# Patient Record
Sex: Male | Born: 1985 | Race: Black or African American | Hispanic: No | Marital: Single | Smoking: Current every day smoker
Health system: Southern US, Community
[De-identification: ages and names within clinical notes are randomized; demographics above are authoritative.]

## PROBLEM LIST (undated history)

## (undated) DIAGNOSIS — F1010 Alcohol abuse, uncomplicated: Secondary | ICD-10-CM

## (undated) DIAGNOSIS — K769 Liver disease, unspecified: Secondary | ICD-10-CM

## (undated) HISTORY — PX: FINGER ARTHROPLASTY: SHX5017

## (undated) HISTORY — PX: TONSILLECTOMY: SUR1361

---
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
## 2008-11-23 ENCOUNTER — Emergency Department (HOSPITAL_COMMUNITY): Admission: EM | Admit: 2008-11-23 | Discharge: 2008-11-23 | Payer: Self-pay | Admitting: Emergency Medicine

## 2009-10-31 ENCOUNTER — Emergency Department (HOSPITAL_COMMUNITY): Admission: EM | Admit: 2009-10-31 | Discharge: 2009-11-01 | Payer: Self-pay | Admitting: Emergency Medicine

## 2009-11-06 ENCOUNTER — Emergency Department (HOSPITAL_COMMUNITY): Admission: EM | Admit: 2009-11-06 | Discharge: 2009-11-06 | Payer: Self-pay | Admitting: Emergency Medicine

## 2009-11-08 ENCOUNTER — Emergency Department (HOSPITAL_COMMUNITY): Admission: EM | Admit: 2009-11-08 | Discharge: 2009-11-08 | Payer: Self-pay | Admitting: Emergency Medicine

## 2010-11-29 ENCOUNTER — Emergency Department (HOSPITAL_COMMUNITY): Admission: EM | Admit: 2010-11-29 | Discharge: 2010-11-29 | Payer: Self-pay | Source: Home / Self Care

## 2010-11-30 ENCOUNTER — Ambulatory Visit (HOSPITAL_COMMUNITY)
Admission: RE | Admit: 2010-11-30 | Discharge: 2010-11-30 | Disposition: A | Discharge status: Home / Self Care | Payer: Self-pay | Source: Ambulatory Visit | Attending: Family Medicine | Admitting: Family Medicine

## 2010-11-30 ENCOUNTER — Other Ambulatory Visit (HOSPITAL_COMMUNITY): Payer: Self-pay | Admitting: Family Medicine

## 2010-11-30 DIAGNOSIS — R059 Cough, unspecified: Secondary | ICD-10-CM

## 2010-11-30 DIAGNOSIS — R05 Cough: Secondary | ICD-10-CM

## 2011-01-14 ENCOUNTER — Emergency Department (HOSPITAL_COMMUNITY)
Admission: EM | Admit: 2011-01-14 | Discharge: 2011-01-14 | Disposition: A | Discharge status: Home / Self Care | Payer: Self-pay | Attending: Emergency Medicine | Admitting: Emergency Medicine

## 2011-01-14 DIAGNOSIS — R21 Rash and other nonspecific skin eruption: Secondary | ICD-10-CM | POA: Insufficient documentation

## 2011-01-14 DIAGNOSIS — B029 Zoster without complications: Secondary | ICD-10-CM | POA: Insufficient documentation

## 2011-01-14 DIAGNOSIS — R000 Tachycardia, unspecified: Secondary | ICD-10-CM | POA: Insufficient documentation

## 2011-01-15 LAB — ETHANOL: Alcohol, Ethyl (B): 169 mg/dL — ABNORMAL HIGH (ref 0–10)

## 2011-01-15 LAB — DIFFERENTIAL
Basophils Absolute: 0.1 10*3/uL (ref 0.0–0.1)
Eosinophils Absolute: 0.1 10*3/uL (ref 0.0–0.7)
Lymphs Abs: 3.8 10*3/uL (ref 0.7–4.0)
Monocytes Relative: 6 % (ref 3–12)
Neutrophils Relative %: 58 % (ref 43–77)

## 2011-01-15 LAB — RAPID URINE DRUG SCREEN, HOSP PERFORMED
Barbiturates: NOT DETECTED
Cocaine: NOT DETECTED
Opiates: POSITIVE — AB
Tetrahydrocannabinol: POSITIVE — AB

## 2011-01-15 LAB — POCT I-STAT, CHEM 8
Calcium, Ion: 0.9 mmol/L — ABNORMAL LOW (ref 1.12–1.32)
Hemoglobin: 16.7 g/dL (ref 13.0–17.0)
Potassium: 4.1 mEq/L (ref 3.5–5.1)
Sodium: 140 mEq/L (ref 135–145)
TCO2: 21 mmol/L (ref 0–100)

## 2011-01-15 LAB — SYNOVIAL CELL COUNT + DIFF, W/ CRYSTALS
Crystals, Fluid: NONE SEEN
Eosinophils-Synovial: 1 % (ref 0–1)
Monocyte-Macrophage-Synovial Fluid: 3 % — ABNORMAL LOW (ref 50–90)
Neutrophil, Synovial: 85 % — ABNORMAL HIGH (ref 0–25)
WBC, Synovial: 2963 /mm3 — ABNORMAL HIGH (ref 0–200)

## 2011-01-15 LAB — URINE MICROSCOPIC-ADD ON

## 2011-01-15 LAB — URINALYSIS, ROUTINE W REFLEX MICROSCOPIC
Nitrite: NEGATIVE
Protein, ur: NEGATIVE mg/dL

## 2011-01-15 LAB — CBC
Hemoglobin: 16.5 g/dL (ref 13.0–17.0)
MCHC: 34.6 g/dL (ref 30.0–36.0)
Platelets: 276 10*3/uL (ref 150–400)
RBC: 4.99 MIL/uL (ref 4.22–5.81)
WBC: 10.8 10*3/uL — ABNORMAL HIGH (ref 4.0–10.5)

## 2011-01-15 LAB — BODY FLUID CULTURE

## 2011-02-13 LAB — DIFFERENTIAL
Basophils Relative: 0 % (ref 0–1)
Eosinophils Absolute: 0.1 10*3/uL (ref 0.0–0.7)
Lymphs Abs: 1.5 10*3/uL (ref 0.7–4.0)
Monocytes Absolute: 0.8 10*3/uL (ref 0.1–1.0)
Monocytes Relative: 10 % (ref 3–12)

## 2011-02-13 LAB — COMPREHENSIVE METABOLIC PANEL
ALT: 22 U/L (ref 0–53)
AST: 33 U/L (ref 0–37)
Albumin: 4.1 g/dL (ref 3.5–5.2)
Alkaline Phosphatase: 118 U/L — ABNORMAL HIGH (ref 39–117)
Calcium: 9.8 mg/dL (ref 8.4–10.5)
GFR calc Af Amer: >60 mL/min (ref >60)
Potassium: 4.4 mEq/L (ref 3.5–5.1)
Sodium: 137 mEq/L (ref 135–145)
Total Protein: 7.3 g/dL (ref 6.0–8.3)

## 2011-02-13 LAB — URINALYSIS, ROUTINE W REFLEX MICROSCOPIC
Bilirubin Urine: NEGATIVE
Glucose, UA: NEGATIVE mg/dL
Hgb urine dipstick: NEGATIVE
Protein, ur: NEGATIVE mg/dL
Urobilinogen, UA: 2 mg/dL — ABNORMAL HIGH (ref 0.0–1.0)

## 2011-02-13 LAB — CBC
Hemoglobin: 16 g/dL (ref 13.0–17.0)
MCHC: 33.5 g/dL (ref 30.0–36.0)
Platelets: 314 10*3/uL (ref 150–400)
RDW: 13.2 % (ref 11.5–15.5)

## 2011-04-18 ENCOUNTER — Ambulatory Visit (HOSPITAL_COMMUNITY)
Admission: RE | Admit: 2011-04-18 | Discharge: 2011-04-18 | Disposition: A | Discharge status: Home / Self Care | Payer: 59 | Attending: Psychiatry | Admitting: Psychiatry

## 2011-04-18 DIAGNOSIS — F1020 Alcohol dependence, uncomplicated: Secondary | ICD-10-CM | POA: Insufficient documentation

## 2011-04-21 ENCOUNTER — Other Ambulatory Visit (HOSPITAL_COMMUNITY): Payer: PRIVATE HEALTH INSURANCE | Admitting: Psychiatry

## 2011-04-24 ENCOUNTER — Other Ambulatory Visit (HOSPITAL_COMMUNITY): Payer: PRIVATE HEALTH INSURANCE | Attending: Psychiatry | Admitting: Psychiatry

## 2011-04-24 DIAGNOSIS — F1020 Alcohol dependence, uncomplicated: Secondary | ICD-10-CM | POA: Insufficient documentation

## 2011-04-26 ENCOUNTER — Other Ambulatory Visit (HOSPITAL_COMMUNITY): Payer: PRIVATE HEALTH INSURANCE | Admitting: Psychiatry

## 2011-04-28 ENCOUNTER — Other Ambulatory Visit (HOSPITAL_COMMUNITY): Payer: PRIVATE HEALTH INSURANCE | Admitting: Psychiatry

## 2011-05-01 ENCOUNTER — Other Ambulatory Visit (HOSPITAL_COMMUNITY): Payer: PRIVATE HEALTH INSURANCE | Admitting: Psychiatry

## 2011-05-05 ENCOUNTER — Other Ambulatory Visit (HOSPITAL_COMMUNITY): Payer: PRIVATE HEALTH INSURANCE | Admitting: Psychiatry

## 2011-05-08 ENCOUNTER — Other Ambulatory Visit (HOSPITAL_COMMUNITY): Payer: PRIVATE HEALTH INSURANCE | Admitting: Psychiatry

## 2011-05-10 ENCOUNTER — Other Ambulatory Visit (HOSPITAL_COMMUNITY): Payer: PRIVATE HEALTH INSURANCE | Admitting: Psychiatry

## 2011-05-12 ENCOUNTER — Other Ambulatory Visit (HOSPITAL_COMMUNITY): Payer: PRIVATE HEALTH INSURANCE | Admitting: Psychiatry

## 2011-05-15 ENCOUNTER — Other Ambulatory Visit (HOSPITAL_COMMUNITY): Payer: PRIVATE HEALTH INSURANCE | Attending: Psychiatry | Admitting: Psychiatry

## 2011-05-17 ENCOUNTER — Other Ambulatory Visit (HOSPITAL_COMMUNITY): Payer: PRIVATE HEALTH INSURANCE | Admitting: Psychiatry

## 2011-05-19 ENCOUNTER — Other Ambulatory Visit (HOSPITAL_COMMUNITY): Payer: PRIVATE HEALTH INSURANCE | Admitting: Psychiatry

## 2011-05-22 ENCOUNTER — Other Ambulatory Visit (HOSPITAL_COMMUNITY): Payer: PRIVATE HEALTH INSURANCE | Admitting: Psychiatry

## 2011-05-24 ENCOUNTER — Other Ambulatory Visit (HOSPITAL_COMMUNITY): Payer: PRIVATE HEALTH INSURANCE | Admitting: Psychiatry

## 2011-05-26 ENCOUNTER — Other Ambulatory Visit (HOSPITAL_COMMUNITY): Payer: PRIVATE HEALTH INSURANCE | Admitting: Psychiatry

## 2011-05-29 ENCOUNTER — Other Ambulatory Visit (HOSPITAL_COMMUNITY): Payer: PRIVATE HEALTH INSURANCE | Admitting: Psychiatry

## 2011-05-31 ENCOUNTER — Other Ambulatory Visit (HOSPITAL_COMMUNITY): Payer: PRIVATE HEALTH INSURANCE | Admitting: Psychiatry

## 2011-06-02 ENCOUNTER — Other Ambulatory Visit (HOSPITAL_COMMUNITY): Payer: PRIVATE HEALTH INSURANCE | Admitting: Psychiatry

## 2011-06-05 ENCOUNTER — Other Ambulatory Visit (HOSPITAL_COMMUNITY): Payer: PRIVATE HEALTH INSURANCE | Admitting: Psychiatry

## 2011-06-07 ENCOUNTER — Other Ambulatory Visit (HOSPITAL_COMMUNITY): Payer: PRIVATE HEALTH INSURANCE | Admitting: Psychiatry

## 2011-06-09 ENCOUNTER — Other Ambulatory Visit (HOSPITAL_COMMUNITY): Payer: PRIVATE HEALTH INSURANCE | Admitting: Psychiatry

## 2011-06-12 ENCOUNTER — Other Ambulatory Visit (HOSPITAL_COMMUNITY): Payer: PRIVATE HEALTH INSURANCE | Admitting: Psychiatry

## 2011-06-14 ENCOUNTER — Other Ambulatory Visit (HOSPITAL_COMMUNITY): Payer: PRIVATE HEALTH INSURANCE | Admitting: Psychiatry

## 2011-12-26 ENCOUNTER — Emergency Department (HOSPITAL_COMMUNITY)
Admission: EM | Admit: 2011-12-26 | Discharge: 2011-12-27 | Disposition: A | Discharge status: Home / Self Care | Payer: Self-pay | Attending: Emergency Medicine | Admitting: Emergency Medicine

## 2011-12-26 DIAGNOSIS — R10814 Left lower quadrant abdominal tenderness: Secondary | ICD-10-CM | POA: Insufficient documentation

## 2011-12-26 DIAGNOSIS — R10816 Epigastric abdominal tenderness: Secondary | ICD-10-CM | POA: Insufficient documentation

## 2011-12-26 DIAGNOSIS — F1010 Alcohol abuse, uncomplicated: Secondary | ICD-10-CM | POA: Insufficient documentation

## 2011-12-26 DIAGNOSIS — R1031 Right lower quadrant pain: Secondary | ICD-10-CM | POA: Insufficient documentation

## 2011-12-26 DIAGNOSIS — F17200 Nicotine dependence, unspecified, uncomplicated: Secondary | ICD-10-CM | POA: Insufficient documentation

## 2011-12-26 DIAGNOSIS — R000 Tachycardia, unspecified: Secondary | ICD-10-CM | POA: Insufficient documentation

## 2011-12-26 NOTE — ED Notes (Signed)
Patient complaining of sharp, stabbing abdominal pain that started around 2000 tonight; patient states that he has been drinking all weekend -- patient states that he is unable to determine how much he has had to drink, but that him and his friends had went through bottles of liquor; patient does have ETOH on board tonight.  Patient reports nausea and vomiting; last emesis 1600 this afternoon.  Patient reporting cold sweats, chills, and fever along with the abdominal pain.

## 2011-12-27 ENCOUNTER — Emergency Department (HOSPITAL_COMMUNITY): Payer: Self-pay

## 2011-12-27 ENCOUNTER — Encounter (HOSPITAL_COMMUNITY): Payer: Self-pay | Admitting: Emergency Medicine

## 2011-12-27 DIAGNOSIS — IMO0001 Reserved for inherently not codable concepts without codable children: Secondary | ICD-10-CM | POA: Insufficient documentation

## 2011-12-27 LAB — CBC
HCT: 43.9 % (ref 39.0–52.0)
Hemoglobin: 16 g/dL (ref 13.0–17.0)
MCV: 89.8 fL (ref 78.0–100.0)
WBC: 6.6 10*3/uL (ref 4.0–10.5)

## 2011-12-27 LAB — COMPREHENSIVE METABOLIC PANEL
Alkaline Phosphatase: 85 U/L (ref 39–117)
BUN: 6 mg/dL (ref 6–23)
Chloride: 105 mEq/L (ref 96–112)
Creatinine, Ser: 0.92 mg/dL (ref 0.50–1.35)
GFR calc Af Amer: >90 mL/min (ref >90)
GFR calc non Af Amer: >90 mL/min (ref >90)
Glucose, Bld: 103 mg/dL — ABNORMAL HIGH (ref 70–99)
Potassium: 3.6 mEq/L (ref 3.5–5.1)
Total Bilirubin: 1.9 mg/dL — ABNORMAL HIGH (ref 0.3–1.2)

## 2011-12-27 LAB — URINALYSIS, ROUTINE W REFLEX MICROSCOPIC
Glucose, UA: NEGATIVE mg/dL
Hgb urine dipstick: NEGATIVE
Ketones, ur: NEGATIVE mg/dL
Leukocytes, UA: NEGATIVE
Protein, ur: NEGATIVE mg/dL
pH: 6 (ref 5.0–8.0)

## 2011-12-27 LAB — LIPASE, BLOOD: Lipase: 17 U/L (ref 11–59)

## 2011-12-27 MED ORDER — FAMOTIDINE 20 MG PO TABS: 20.0000 mg | ORAL_TABLET | Freq: Two times a day (BID) | ORAL | Status: DC

## 2011-12-27 MED ORDER — GI COCKTAIL ~~LOC~~
30.0000 mL | Freq: Once | ORAL | Status: AC
  Administered 2011-12-27: 30 mL via ORAL
  Filled 2011-12-27: qty 30

## 2011-12-27 MED ORDER — ONDANSETRON HCL 4 MG/2ML IJ SOLN
4.0000 mg | Freq: Once | INTRAMUSCULAR | Status: AC
  Administered 2011-12-27: 4 mg via INTRAVENOUS
  Filled 2011-12-27: qty 2

## 2011-12-27 MED ORDER — MORPHINE SULFATE 4 MG/ML IJ SOLN
4.0000 mg | Freq: Once | INTRAMUSCULAR | Status: AC
  Administered 2011-12-27: 4 mg via INTRAVENOUS
  Filled 2011-12-27: qty 1

## 2011-12-27 MED ORDER — PANTOPRAZOLE SODIUM 40 MG IV SOLR
40.0000 mg | Freq: Once | INTRAVENOUS | Status: AC
  Administered 2011-12-27: 40 mg via INTRAVENOUS
  Filled 2011-12-27: qty 40

## 2011-12-27 MED ORDER — IOHEXOL 300 MG/ML  SOLN
20.0000 mL | INTRAMUSCULAR | Status: AC
  Administered 2011-12-27: 20 mL via ORAL

## 2011-12-27 MED ORDER — FAMOTIDINE IN NACL 20-0.9 MG/50ML-% IV SOLN
20.0000 mg | Freq: Once | INTRAVENOUS | Status: AC
  Administered 2011-12-27: 20 mg via INTRAVENOUS
  Filled 2011-12-27: qty 50

## 2011-12-27 MED ORDER — IOHEXOL 300 MG/ML  SOLN
100.0000 mL | Freq: Once | INTRAMUSCULAR | Status: AC | PRN
  Administered 2011-12-27: 100 mL via INTRAVENOUS

## 2011-12-27 MED ORDER — SODIUM CHLORIDE 0.9 % IV BOLUS (SEPSIS)
1000.0000 mL | Freq: Once | INTRAVENOUS | Status: AC
  Administered 2011-12-27: 1000 mL via INTRAVENOUS

## 2011-12-27 MED ORDER — PROMETHAZINE HCL 25 MG PO TABS: 25.0000 mg | ORAL_TABLET | Freq: Four times a day (QID) | ORAL | Status: DC | PRN

## 2011-12-27 NOTE — ED Notes (Signed)
Pt finished drinking contrast, CT notified, pt cannot go to CT until 04:45

## 2011-12-27 NOTE — Discharge Instructions (Signed)
Abdominal Pain  Rest, be sure to drink plan fluids, and avoid alcohol.  Abdominal pain can be caused by many things. Your caregiver decides the seriousness of your pain by an examination and possibly blood tests and X-rays. Many cases can be observed and treated at home. Most abdominal pain is not caused by a disease and will probably improve without treatment. However, in many cases, more time must pass before a clear cause of the pain can be found. Before that point, it may not be known if you need more testing, or if hospitalization or surgery is needed. HOME CARE INSTRUCTIONS   Do not take laxatives unless directed by your caregiver.   Take pain medicine only as directed by your caregiver.   Only take over-the-counter or prescription medicines for pain, discomfort, or fever as directed by your caregiver.   Try a clear liquid diet (broth, tea, or water) for as long as directed by your caregiver. Slowly move to a bland diet as tolerated.  SEEK IMMEDIATE MEDICAL CARE IF:   The pain does not go away.   You have a fever.   You keep throwing up (vomiting).   The pain is felt only in portions of the abdomen. Pain in the right side could possibly be appendicitis. In an adult, pain in the left lower portion of the abdomen could be colitis or diverticulitis.   You pass bloody or black tarry stools.  MAKE SURE YOU:   Understand these instructions.   Will watch your condition.   Will get help right away if you are not doing well or get worse.

## 2011-12-27 NOTE — ED Notes (Signed)
rx x 2, pt voiced understanding to f/u with PCP in 2 days for recheck

## 2011-12-27 NOTE — ED Notes (Signed)
Pt to ED for eval of bil lower abd pain; pt reports that he has been drinking heavily since Friday for a friend's birthday, and today he started to have sharp pains in his abd a/w n/v; pt denies diarrhea; pt reports that he thinks that he is having "withdrawal from alcohol"; pt reports that pain is intermittent

## 2011-12-27 NOTE — ED Provider Notes (Signed)
History     CSN: [id number]  Arrival date & time 12/26/11  2319   First MD Initiated Contact with Patient 12/27/11 0258      Chief Complaint  Patient presents with  . Abdominal Pain    (Consider location/radiation/quality/duration/timing/severity/associated sxs/prior treatment) Patient is a 26 y.o. male presenting with abdominal pain. The history is provided by the patient.  Abdominal Pain The primary symptoms of the illness include abdominal pain. The primary symptoms of the illness do not include fever, shortness of breath or dysuria. The current episode started 3 to 5 hours ago. The onset of the illness was gradual. The problem has not changed since onset. The illness is associated with alcohol use. The patient has not had a change in bowel habit. Symptoms associated with the illness do not include chills, anorexia, diaphoresis, heartburn, constipation, urgency, hematuria, frequency or back pain. Significant associated medical issues do not include GERD, inflammatory bowel disease or gallstones.   Moderate in severity. Patient is very anxious and worried that his symptoms are related to drinking abdominal pain all over but more so right lower left lower quadrant. Sharp in quality. No radiation of pain. No emesis or diarrhea. No blood in stools. Patient admits to heavy drinking. Past Medical History  Diagnosis Date  . No significant medical problems     History reviewed. No pertinent past surgical history.  History reviewed. No pertinent family history.  History  Substance Use Topics  . Smoking status: Current Everyday Smoker    Types: Cigarettes  . Smokeless tobacco: Not on file  . Alcohol Use: Yes     Heavy Drinking      Review of Systems  Constitutional: Negative for fever, chills and diaphoresis.  HENT: Negative for neck pain and neck stiffness.   Eyes: Negative for pain.  Respiratory: Negative for shortness of breath.   Cardiovascular: Negative for chest pain.    Gastrointestinal: Positive for abdominal pain. Negative for heartburn, constipation and anorexia.  Genitourinary: Negative for dysuria, urgency, frequency and hematuria.  Musculoskeletal: Negative for back pain.  Skin: Negative for rash.  Neurological: Negative for headaches.  All other systems reviewed and are negative.    Allergies  Peanut-containing drug products  Home Medications  No current outpatient prescriptions on file.  BP 90/41  Pulse 81  Temp(Src) 98.3 F (36.8 C) (Oral)  Resp 18  SpO2 99%  Physical Exam  Constitutional: He is oriented to person, place, and time. He appears well-developed and well-nourished.  HENT:  Head: Normocephalic and atraumatic.  Eyes: Conjunctivae and EOM are normal. Pupils are equal, round, and reactive to light.  Neck: Trachea normal. Neck supple. No thyromegaly present.  Cardiovascular: Normal rate, regular rhythm, S1 normal, S2 normal and normal pulses.     No systolic murmur is present   No diastolic murmur is present  Pulses:      Radial pulses are 2+ on the right side, and 2+ on the left side.  Pulmonary/Chest: Effort normal and breath sounds normal. He has no wheezes. He has no rhonchi. He has no rales. He exhibits no tenderness.  Abdominal: Soft. Normal appearance and bowel sounds are normal. He exhibits no distension. There is no rebound, no guarding, no CVA tenderness and negative Murphy's sign.       Tender rather quadrant left lower quadrant abdomen and somewhat epigastric area. No significant right upper or left upper quadrant tenderness  Musculoskeletal: Normal range of motion. He exhibits no edema and no tenderness.  Moves all extremities x4  Neurological: He is alert and oriented to person, place, and time. He has normal strength. No cranial nerve deficit or sensory deficit. GCS eye subscore is 4. GCS verbal subscore is 5. GCS motor subscore is 6.  Skin: Skin is warm and dry. No rash noted. He is not diaphoretic.   Psychiatric: His speech is normal.       Cooperative and appropriate    ED Course  Procedures (including critical care time)  Labs Reviewed  CBC - Abnormal; Notable for the following:    MCHC 36.4 (*)    All other components within normal limits  COMPREHENSIVE METABOLIC PANEL - Abnormal; Notable for the following:    Glucose, Bld 103 (*)    AST 50 (*)    Total Bilirubin 1.9 (*)    All other components within normal limits  URINALYSIS, ROUTINE W REFLEX MICROSCOPIC  LIPASE, BLOOD   Ct Abdomen Pelvis W Contrast  12/27/2011  *RADIOLOGY REPORT*  Clinical Data: Right lower quadrant abdominal pain; diaphoresis, chills and fever.  CT ABDOMEN AND PELVIS WITH CONTRAST  Technique:  Multidetector CT imaging of the abdomen and pelvis was performed following the standard protocol during bolus administration of intravenous contrast.  Contrast:  100 mL of Omnipaque 300 IV contrast  Comparison: CT of the abdomen and pelvis performed 10/31/2009  Findings: The visualized lung bases are clear.  The liver and spleen are unremarkable in appearance.  The gallbladder is within normal limits.  The pancreas and adrenal glands are unremarkable.  The kidneys are unremarkable in appearance.  There is no evidence of hydronephrosis.  No renal or ureteral stones are seen.  No perinephric stranding is appreciated.  No free fluid is identified.  The small bowel is unremarkable in appearance; this includes a focus of node-like small bowel adjacent to the aortic bifurcation.  No retroperitoneal lymphadenopathy is seen.  The stomach is within normal limits.  No acute vascular abnormalities are seen.  The appendix is normal in caliber and contains air, without evidence for appendicitis.  The colon is unremarkable in appearance.  The bladder is mildly distended and grossly unremarkable in appearance.  The prostate remains normal in size.  No inguinal lymphadenopathy is seen.  No acute osseous abnormalities are identified.   IMPRESSION: Unremarkable contrast CT of the abdomen and pelvis.  Original Report Authenticated By: Tonia Ghent, M.D.   IV fluids and IV morphine pain control. Recheck tachycardia improved and pain improved. CT reviewed as above   MDM   Abdominal pain and heavy drinker likely some element of alcohol gastritis. No CT findings to suggest etiology of lower bowel pain.  Symptoms improved in the ED and patient is stable for discharge home. Outpatient referrals provided. Reliable historian verbalizes understanding abdominal pain precautions        Sunnie Nielsen, MD 12/27/11 [phone]

## 2012-05-23 ENCOUNTER — Emergency Department (HOSPITAL_COMMUNITY)
Admission: EM | Admit: 2012-05-23 | Discharge: 2012-05-24 | Disposition: A | Discharge status: Home / Self Care | Payer: Self-pay

## 2012-05-23 ENCOUNTER — Encounter (HOSPITAL_COMMUNITY): Payer: Self-pay | Admitting: Emergency Medicine

## 2012-05-23 HISTORY — DX: Liver disease, unspecified: K76.9

## 2012-05-23 NOTE — ED Notes (Signed)
Patient reports that he was walking by the bathroom and he passed out. The patient also reports a HA and abdominal pain with N/V/ The patient states that he did not want to come here but he felt like if he did not "I would die and I dont want to die"

## 2012-05-23 NOTE — ED Notes (Signed)
Pt called x3 to be taken to an exam room, no response from lobby.

## 2012-10-23 ENCOUNTER — Encounter (HOSPITAL_COMMUNITY): Payer: Self-pay | Admitting: *Deleted

## 2012-10-23 ENCOUNTER — Emergency Department (HOSPITAL_COMMUNITY): Payer: Self-pay

## 2012-10-23 ENCOUNTER — Emergency Department (HOSPITAL_COMMUNITY)
Admission: EM | Admit: 2012-10-23 | Discharge: 2012-10-23 | Disposition: A | Discharge status: Home / Self Care | Payer: Self-pay | Attending: Emergency Medicine | Admitting: Emergency Medicine

## 2012-10-23 DIAGNOSIS — F17200 Nicotine dependence, unspecified, uncomplicated: Secondary | ICD-10-CM | POA: Insufficient documentation

## 2012-10-23 DIAGNOSIS — W1809XA Striking against other object with subsequent fall, initial encounter: Secondary | ICD-10-CM | POA: Insufficient documentation

## 2012-10-23 DIAGNOSIS — R51 Headache: Secondary | ICD-10-CM | POA: Insufficient documentation

## 2012-10-23 DIAGNOSIS — Y939 Activity, unspecified: Secondary | ICD-10-CM | POA: Insufficient documentation

## 2012-10-23 DIAGNOSIS — S0181XA Laceration without foreign body of other part of head, initial encounter: Secondary | ICD-10-CM

## 2012-10-23 DIAGNOSIS — F1010 Alcohol abuse, uncomplicated: Secondary | ICD-10-CM | POA: Insufficient documentation

## 2012-10-23 DIAGNOSIS — Y929 Unspecified place or not applicable: Secondary | ICD-10-CM | POA: Insufficient documentation

## 2012-10-23 DIAGNOSIS — S0180XA Unspecified open wound of other part of head, initial encounter: Secondary | ICD-10-CM | POA: Insufficient documentation

## 2012-10-23 DIAGNOSIS — Z23 Encounter for immunization: Secondary | ICD-10-CM | POA: Insufficient documentation

## 2012-10-23 DIAGNOSIS — Z8719 Personal history of other diseases of the digestive system: Secondary | ICD-10-CM | POA: Insufficient documentation

## 2012-10-23 MED ORDER — TETANUS-DIPHTH-ACELL PERTUSSIS 5-2.5-18.5 LF-MCG/0.5 IM SUSP
0.5000 mL | Freq: Once | INTRAMUSCULAR | Status: AC
  Administered 2012-10-23: 0.5 mL via INTRAMUSCULAR
  Filled 2012-10-23: qty 0.5

## 2012-10-23 NOTE — ED Notes (Signed)
Suture cart placed outside of room  

## 2012-10-23 NOTE — ED Provider Notes (Addendum)
History  This chart was scribed for Devin Sprout, MD by Shari Heritage, ED Scribe. The patient was seen in room TR06C/TR06C. Patient's care was started at 1641.  CSN: [id number]  Arrival date & time 10/23/12  1615   First MD Initiated Contact with Patient 10/23/12 1641      Chief Complaint  Patient presents with  . Facial Laceration     The history is provided by the patient. No language interpreter was used.    HPI Comments: Devin Hudson is a 26 y.o. male who presents to the Emergency Department complaining of a facial laceration to the left eyebrow that occurred 14.5 hours ago. There is associated pain at the laceration site and constant, moderate, dull, non-radiating headache. Patient states that he was intoxicated when he leaned forward too far, fell and hit his head on a toilet. Patient says that he cannot remember the seconds immediately after the incident so cannot answer whether or not he lost consciousness. Patient denies visual changes or neck pain. Patient's Tdap is out of date.   Past Medical History  Diagnosis Date  . No significant medical problems   . Liver damage     History reviewed. No pertinent past surgical history.  History reviewed. No pertinent family history.  History  Substance Use Topics  . Smoking status: Current Every Day Smoker    Types: Cigarettes  . Smokeless tobacco: Not on file  . Alcohol Use: Yes     Comment: Heavy Drinking      Review of Systems A complete 10 system review of systems was obtained and all systems are negative except as noted in the HPI and PMH.  Allergies  Other; Peanut butter flavor; and Peanut-containing drug products  Home Medications   Current Outpatient Rx  Name  Route  Sig  Dispense  Refill  . VISINE OP   Ophthalmic   Apply 2 drops to eye as needed. For dryness           Triage Vitals: BP 132/96  Pulse 120  Temp 97.4 F (36.3 C) (Oral)  Resp 20  SpO2 99%  Physical Exam  Constitutional:  He is oriented to person, place, and time. He appears well-developed and well-nourished. No distress.  HENT:  Head: Normocephalic. Head is with laceration.       3 cm laceration over his left eyebrow with some swelling and ecchymosis.    Eyes: EOM are normal. Pupils are equal, round, and reactive to light.  Cardiovascular: Normal rate.   Pulmonary/Chest: Effort normal.  Abdominal: He exhibits no distension.  Neurological: He is alert and oriented to person, place, and time. He has normal strength. No sensory deficit.       No sensory or motor deficits.  Skin: No rash noted.  Psychiatric: He has a normal mood and affect. His behavior is normal.    ED Course  Procedures (including critical care time) DIAGNOSTIC STUDIES: Oxygen Saturation is 99% on room air, normal by my interpretation.    COORDINATION OF CARE: 4:45 PM- Patient informed of current plan for treatment and evaluation and agrees with plan at this time.      Labs Reviewed - No data to display Ct Head Wo Contrast  10/23/2012  *RADIOLOGY REPORT*  Clinical Data:  Fall.  Laceration  CT HEAD WITHOUT CONTRAST  Technique:  Contiguous axial images were obtained from the base of the skull through the vertex without contrast  Comparison:  None.  Findings:  The brain has a normal  appearance without evidence for hemorrhage, acute infarction, hydrocephalus, or mass lesion.  There is no extra axial fluid collection.  The skull and paranasal sinuses are normal.  IMPRESSION: Normal CT of the head without contrast.   Original Report Authenticated By: Janeece Riggers, M.D.    LACERATION REPAIR Performed by: Devin Hudson Authorized by: Devin Hudson Consent: Verbal consent obtained. Risks and benefits: risks, benefits and alternatives were discussed Consent given by: patient Patient identity confirmed: provided demographic data Prepped and Draped in normal sterile fashion Wound explored  Laceration Location: left  eyebrow  Laceration Length: 3cm  No Foreign Bodies seen or palpated  Anesthesia: local infiltration  Local anesthetic: lidocaine 1% with epinephrine  Anesthetic total: 3 ml  Irrigation method: syringe Amount of cleaning: standard  Skin closure: 4.0 Prolene   Number of sutures: 1  Technique: running  Patient tolerance: Patient tolerated the procedure well with no immediate complications.   1. Facial laceration       MDM   Patient with laceration to the left eyebrow last night when he was intoxicated and hit his head on the toilet. Since that time he's had severe headache with possible LOC and nausea. Patient's laceration has been present for approximately 12 hours however it still is open with intermittent bleeding. Tetanus shot updated CT scan of the head pending. Wound repaired.   5:31 PM Ct wnl.   I personally performed the services described in this documentation, which was scribed in my presence.  The recorded information has been reviewed and considered.    Devin Sprout, MD 10/23/12 1656  Devin Sprout, MD 10/23/12 1731  Devin Sprout, MD 10/23/12 4540  Devin Sprout, MD 11/04/12 1432  Devin Sprout, MD 11/04/12 1433

## 2012-10-23 NOTE — ED Notes (Signed)
Reports falling last night and has laceration to left eyebrow. No bleeding at this time having headache since the fall.

## 2012-10-30 ENCOUNTER — Encounter (HOSPITAL_COMMUNITY): Payer: Self-pay | Admitting: *Deleted

## 2012-10-30 ENCOUNTER — Emergency Department (HOSPITAL_COMMUNITY)
Admission: EM | Admit: 2012-10-30 | Discharge: 2012-10-30 | Disposition: A | Discharge status: Home / Self Care | Payer: Self-pay | Attending: Emergency Medicine | Admitting: Emergency Medicine

## 2012-10-30 DIAGNOSIS — F17200 Nicotine dependence, unspecified, uncomplicated: Secondary | ICD-10-CM | POA: Insufficient documentation

## 2012-10-30 DIAGNOSIS — Z4802 Encounter for removal of sutures: Secondary | ICD-10-CM | POA: Insufficient documentation

## 2012-10-30 NOTE — ED Notes (Signed)
PA at the bedside performing suture care. Pt tolerating without difficulty. 

## 2012-10-30 NOTE — ED Provider Notes (Signed)
History     CSN: [id number]  Arrival date & time 10/30/12  1108   First MD Initiated Contact with Patient 10/30/12 1232      Chief Complaint  Patient presents with  . Suture / Staple Removal    (Consider location/radiation/quality/duration/timing/severity/associated sxs/prior treatment) Patient is a 27 y.o. male presenting with suture removal. The history is provided by the patient and medical records.  Suture / Staple Removal  The sutures were placed 7 to 10 days ago. Treatments since wound repair include regular soap and water washings. There has been no drainage from the wound. There is no redness present. There is no swelling present. The pain has improved.    Devin Hudson is a 27 y.o. male  with a hx of laceration presents to the Emergency Department for suture removal from the left eyebrow for laceration onset days ago . Wound is well healing, no drainage.  Patient states persistent mild pain in the area that is controlled with ibuprofen.  Past Medical History  Diagnosis Date  . No significant medical problems   . Liver damage     History reviewed. No pertinent past surgical history.  History reviewed. No pertinent family history.  History  Substance Use Topics  . Smoking status: Current Every Day Smoker    Types: Cigarettes  . Smokeless tobacco: Not on file  . Alcohol Use: Yes     Comment: Heavy Drinking      Review of Systems  Constitutional: Negative for fever and fatigue.  HENT: Negative for mouth sores, neck pain and neck stiffness.   Respiratory: Negative for cough and shortness of breath.   Cardiovascular: Negative for chest pain.  Gastrointestinal: Negative for nausea, vomiting, abdominal pain, diarrhea and constipation.  Skin: Negative for rash.  Neurological: Negative for headaches.  Psychiatric/Behavioral: The patient is not nervous/anxious.   All other systems reviewed and are negative.    Allergies  Other; Peanut butter flavor; and  Peanut-containing drug products  Home Medications   Current Outpatient Rx  Name  Route  Sig  Dispense  Refill  . IBUPROFEN 200 MG PO TABS   Oral   Take 200 mg by mouth every 6 (six) hours as needed. For pain         . VISINE OP   Ophthalmic   Apply 2 drops to eye as needed. For dryness           BP 129/72  Pulse 98  Temp 97.4 F (36.3 C)  Resp 16  SpO2 99%  Physical Exam  Nursing note and vitals reviewed. Constitutional: He appears well-developed and well-nourished. No distress.  HENT:  Head: Normocephalic and atraumatic.  Eyes: Conjunctivae normal are normal. No scleral icterus.  Cardiovascular: Normal rate, regular rhythm and intact distal pulses.   Pulmonary/Chest: Effort normal and breath sounds normal. No respiratory distress.  Musculoskeletal: Normal range of motion. He exhibits no edema.  Neurological: He is alert.  Skin: Skin is warm and dry. He is not diaphoretic. No erythema.       Well healed wound over the left eyebrow with sutures in place. No signs of cellulitis, no erythema, no drainage, ages remain approximated    ED Course  Procedures (including critical care time)  Labs Reviewed - No data to display No results found.  SUTURE REMOVAL Performed by: Dierdre Forth  Consent: Verbal consent obtained. Patient identity confirmed: provided demographic data Time out: Immediately prior to procedure a "time out" was called to verify the correct patient,  procedure, equipment, support staff and site/side marked as required.  Location details: L eyebrow  Wound Appearance: clean  Sutures/Staples Removed: running stitch  Facility: sutures placed in this facility Patient tolerance: Patient tolerated the procedure well with no immediate complications.     1. Visit for suture removal       MDM  Devin Hudson presents for suture removal.  Pt to ER for staple/suture removal and wound check as above. Procedure tolerated well. Vitals  normal, no signs of infection. Scar minimization & return precautions given at dc.   1. Medications: usual home medications, ibuprofen for pain control 2. Treatment: rest, drink plenty of fluids, use maderma to reduce scarring 3. Follow Up: Please followup with your primary doctor for discussion of your diagnoses and further evaluation after today's visit;     Dahlia Client Nolon Yellin, PA-C 10/30/12 1244

## 2012-10-30 NOTE — ED Notes (Signed)
To ED for suture removal from left eyebrow. This is day 7. No drainage or s/s infection

## 2012-10-30 NOTE — ED Provider Notes (Signed)
Medical screening examination/treatment/procedure(s) were performed by non-physician practitioner and as supervising physician I was immediately available for consultation/collaboration.   Archita Lomeli L Aiman Noe, MD 10/30/12 1733 

## 2012-10-30 NOTE — ED Notes (Addendum)
Pt to department for evaluation of L eyebrow suture removal. Denies pain. No s/s of infection. No drainage noted.

## 2013-04-03 ENCOUNTER — Encounter (HOSPITAL_COMMUNITY): Payer: Self-pay

## 2013-04-03 ENCOUNTER — Emergency Department (HOSPITAL_COMMUNITY)
Admission: EM | Admit: 2013-04-03 | Discharge: 2013-04-03 | Disposition: A | Discharge status: Home / Self Care | Payer: Self-pay | Attending: Emergency Medicine | Admitting: Emergency Medicine

## 2013-04-03 DIAGNOSIS — Y9389 Activity, other specified: Secondary | ICD-10-CM | POA: Insufficient documentation

## 2013-04-03 DIAGNOSIS — Z8719 Personal history of other diseases of the digestive system: Secondary | ICD-10-CM | POA: Insufficient documentation

## 2013-04-03 DIAGNOSIS — S91309A Unspecified open wound, unspecified foot, initial encounter: Secondary | ICD-10-CM | POA: Insufficient documentation

## 2013-04-03 DIAGNOSIS — T148XXA Other injury of unspecified body region, initial encounter: Secondary | ICD-10-CM

## 2013-04-03 DIAGNOSIS — Y9289 Other specified places as the place of occurrence of the external cause: Secondary | ICD-10-CM | POA: Insufficient documentation

## 2013-04-03 DIAGNOSIS — F17200 Nicotine dependence, unspecified, uncomplicated: Secondary | ICD-10-CM | POA: Insufficient documentation

## 2013-04-03 DIAGNOSIS — W268XXA Contact with other sharp object(s), not elsewhere classified, initial encounter: Secondary | ICD-10-CM | POA: Insufficient documentation

## 2013-04-03 DIAGNOSIS — Z23 Encounter for immunization: Secondary | ICD-10-CM | POA: Insufficient documentation

## 2013-04-03 MED ORDER — CIPROFLOXACIN HCL 500 MG PO TABS: 500.0000 mg | ORAL_TABLET | Freq: Two times a day (BID) | ORAL | Status: DC

## 2013-04-03 MED ORDER — HYDROCODONE-ACETAMINOPHEN 5-325 MG PO TABS: 2.0000 | ORAL_TABLET | Freq: Four times a day (QID) | ORAL | Status: DC | PRN

## 2013-04-03 MED ORDER — PROMETHAZINE HCL 25 MG PO TABS: 25.0000 mg | ORAL_TABLET | Freq: Four times a day (QID) | ORAL | Status: DC | PRN

## 2013-04-03 MED ORDER — TETANUS-DIPHTH-ACELL PERTUSSIS 5-2.5-18.5 LF-MCG/0.5 IM SUSP
0.5000 mL | Freq: Once | INTRAMUSCULAR | Status: AC
  Administered 2013-04-03: 0.5 mL via INTRAMUSCULAR
  Filled 2013-04-03: qty 0.5

## 2013-04-03 NOTE — ED Notes (Signed)
Patient stepped on a nail with his left foot. Patient reports that his left foot is red and slightly swollen. Patient rates pain 9/10 .

## 2013-04-03 NOTE — ED Provider Notes (Signed)
History    This chart was scribed for non-physician practitioner, Junious Silk PA-C, working with Glynn Octave, MD by Donne Anon, ED Scribe. This patient was seen in room WTR9/WTR9 and the patient's care was started at 1900.   CSN: [id number]  Arrival date & time 04/03/13  1738   First MD Initiated Contact with Patient 04/03/13 1900      Chief Complaint  Patient presents with  . Foot Injury     The history is provided by the patient. No language interpreter was used.   HPI Comments: Devin Hudson is a 27 y.o. 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 He reports there was no pain immediately after the accident, and the pain began a few hours later when he woke up from a nap. He states he was ambulatory after the accident, but that bearing weight makes the pain worse. He denies fever, chills, nausea, vomiting, abdominal pain or any other pain.  He believes his last Tetanus shot was February 2013, but he is not sure.  Past Medical History  Diagnosis Date  . No significant medical problems   . Liver damage     History reviewed. No pertinent past surgical history.  History reviewed. No pertinent family history.  History  Substance Use Topics  . Smoking status: Current Every Day Smoker -- 0.50 packs/day    Types: Cigarettes  . Smokeless tobacco: Never Used  . Alcohol Use: Yes     Comment: occasionally      Review of Systems  Constitutional: Negative for chills.  Gastrointestinal: Negative for nausea, vomiting and abdominal pain.  Skin: Positive for wound.  All other systems reviewed and are negative.    Allergies  Other; Peanut butter flavor; and Peanut-containing drug products  Home Medications   Current Outpatient Rx  Name  Route   Sig  Dispense  Refill  . ibuprofen (ADVIL,MOTRIN) 200 MG tablet   Oral   Take 200 mg by mouth every 6 (six) hours as needed. For pain         . Tetrahydrozoline HCl (VISINE OP)   Ophthalmic   Apply 2 drops to eye as needed. For dryness           BP 124/77  Pulse 72  Temp(Src) 98.6 F (37 C) (Oral)  Resp 16  SpO2 99%  Physical Exam  Nursing note and vitals reviewed. Constitutional: He is oriented to person, place, and time. He appears well-developed and well-nourished. No distress.  HENT:  Head: Normocephalic and atraumatic.  Right Ear: External ear normal.  Left Ear: External ear normal.  Nose: Nose normal.  Eyes: Conjunctivae are normal.  Neck: Normal range of motion. No tracheal deviation present.  Cardiovascular: Normal rate.   Pulmonary/Chest: Effort normal. No stridor.  Musculoskeletal: Normal range of motion.  Neurological: He is alert and oriented to person, place, and time.  Skin: Skin is warm and dry. He is not diaphoretic.  Small puncture wound to left foot without surrounding erythema or irritation. Appears clean. No foreign bodies visualized.  Psychiatric: He has a normal mood and affect. His behavior is normal.    ED Course  Irrigation Date/Time: 04/04/2013 2:34 AM Performed by: Mora Bellman Authorized by: Mora Bellman Consent: Verbal consent obtained. written consent not obtained. The procedure  was performed in an emergent situation. Risks and benefits: risks, benefits and alternatives were discussed Consent given by: patient Patient understanding: patient states understanding of the procedure being performed Patient consent: the patient's understanding of the procedure matches consent given Required items: required blood products, implants, devices, and special equipment available Patient identity confirmed: verbally with patient and arm band Time out: Immediately prior to procedure a "time out" was called to verify the correct patient,  procedure, equipment, support staff and site/side marked as required. Preparation: Patient was prepped and draped in the usual sterile fashion. Local anesthesia used: no Patient sedated: no Patient tolerance: Patient tolerated the procedure well with no immediate complications. Comments: Irrigated wound with NS. No foreign bodies appreciated.    (including critical care time) DIAGNOSTIC STUDIES: Oxygen Saturation is 99% on RA, normal by my interpretation.    COORDINATION OF CARE: 7:40 PM Discussed treatment plan which includes irrigating the wound, a Tetanus shot and an antibiotic with pt at bedside and pt agreed to plan.    Labs Reviewed - No data to display No results found.   1. Puncture wound       MDM  Patient presents with puncture wound through his shoe. It was irrigated well. No retained foreign bodies appreciated. Tdap given as patient was unsure when his last immunization was. Cipro coverage. Return instructions given. Vital signs stable for discharge. Patient / Family / Caregiver informed of clinical course, understand medical decision-making process, and agree with plan.    I personally performed the services described in this documentation, which was scribed in my presence. The recorded information has been reviewed and is accurate.       Mora Bellman, PA-C 04/04/13 [phone]

## 2013-04-04 NOTE — ED Provider Notes (Signed)
Medical screening examination/treatment/procedure(s) were performed by non-physician practitioner and as supervising physician I was immediately available for consultation/collaboration.   Glynn Octave, MD 04/04/13 1148

## 2014-05-10 ENCOUNTER — Encounter (HOSPITAL_COMMUNITY): Payer: Self-pay | Admitting: Emergency Medicine

## 2014-05-10 ENCOUNTER — Emergency Department (HOSPITAL_COMMUNITY)
Admission: EM | Admit: 2014-05-10 | Discharge: 2014-05-10 | Disposition: A | Discharge status: Home / Self Care | Payer: PRIVATE HEALTH INSURANCE | Attending: Emergency Medicine | Admitting: Emergency Medicine

## 2014-05-10 DIAGNOSIS — F10229 Alcohol dependence with intoxication, unspecified: Secondary | ICD-10-CM | POA: Insufficient documentation

## 2014-05-10 DIAGNOSIS — Z8719 Personal history of other diseases of the digestive system: Secondary | ICD-10-CM | POA: Insufficient documentation

## 2014-05-10 DIAGNOSIS — F1010 Alcohol abuse, uncomplicated: Secondary | ICD-10-CM

## 2014-05-10 DIAGNOSIS — F10920 Alcohol use, unspecified with intoxication, uncomplicated: Secondary | ICD-10-CM

## 2014-05-10 DIAGNOSIS — F17200 Nicotine dependence, unspecified, uncomplicated: Secondary | ICD-10-CM | POA: Insufficient documentation

## 2014-05-10 DIAGNOSIS — F1210 Cannabis abuse, uncomplicated: Secondary | ICD-10-CM | POA: Insufficient documentation

## 2014-05-10 DIAGNOSIS — Z79899 Other long term (current) drug therapy: Secondary | ICD-10-CM | POA: Insufficient documentation

## 2014-05-10 HISTORY — DX: Alcohol abuse, uncomplicated: F10.10

## 2014-05-10 LAB — COMPREHENSIVE METABOLIC PANEL
ALBUMIN: 4.4 g/dL (ref 3.5–5.2)
ALT: 73 U/L — AB (ref 0–53)
AST: 94 U/L — AB (ref 0–37)
Alkaline Phosphatase: 89 U/L (ref 39–117)
Anion gap: 19 — ABNORMAL HIGH (ref 5–15)
BILIRUBIN TOTAL: 1.2 mg/dL (ref 0.3–1.2)
BUN: 6 mg/dL (ref 6–23)
CO2: 22 meq/L (ref 19–32)
CREATININE: 0.71 mg/dL (ref 0.50–1.35)
Calcium: 9.4 mg/dL (ref 8.4–10.5)
Chloride: 98 mEq/L (ref 96–112)
GFR calc Af Amer: >90 mL/min (ref >90)
GFR calc non Af Amer: >90 mL/min (ref >90)
GLUCOSE: 110 mg/dL — AB (ref 70–99)
POTASSIUM: 3.6 meq/L — AB (ref 3.7–5.3)
Sodium: 139 mEq/L (ref 137–147)
Total Protein: 8.6 g/dL — ABNORMAL HIGH (ref 6.0–8.3)

## 2014-05-10 LAB — RAPID URINE DRUG SCREEN, HOSP PERFORMED
AMPHETAMINES: NOT DETECTED
BARBITURATES: NOT DETECTED
Benzodiazepines: NOT DETECTED
COCAINE: NOT DETECTED
OPIATES: NOT DETECTED
TETRAHYDROCANNABINOL: POSITIVE — AB

## 2014-05-10 LAB — CBC WITH DIFFERENTIAL/PLATELET
Basophils Absolute: 0.1 10*3/uL (ref 0.0–0.1)
Basophils Relative: 1 % (ref 0–1)
EOS ABS: 0.1 10*3/uL (ref 0.0–0.7)
EOS PCT: 2 % (ref 0–5)
HEMATOCRIT: 44.2 % (ref 39.0–52.0)
HEMOGLOBIN: 15.9 g/dL (ref 13.0–17.0)
LYMPHS ABS: 2.6 10*3/uL (ref 0.7–4.0)
LYMPHS PCT: 44 % (ref 12–46)
MCH: 33.3 pg (ref 26.0–34.0)
MCHC: 36 g/dL (ref 30.0–36.0)
MCV: 92.7 fL (ref 78.0–100.0)
MONOS PCT: 8 % (ref 3–12)
Monocytes Absolute: 0.5 10*3/uL (ref 0.1–1.0)
Neutro Abs: 2.7 10*3/uL (ref 1.7–7.7)
Neutrophils Relative %: 45 % (ref 43–77)
Platelets: 227 10*3/uL (ref 150–400)
RBC: 4.77 MIL/uL (ref 4.22–5.81)
RDW: 12.3 % (ref 11.5–15.5)
WBC: 6 10*3/uL (ref 4.0–10.5)

## 2014-05-10 LAB — ETHANOL: Alcohol, Ethyl (B): 286 mg/dL — ABNORMAL HIGH (ref 0–11)

## 2014-05-10 MED ORDER — LORAZEPAM 1 MG PO TABS
0.0000 mg | ORAL_TABLET | Freq: Four times a day (QID) | ORAL | Status: DC
  Administered 2014-05-10: 1 mg via ORAL
  Filled 2014-05-10: qty 1

## 2014-05-10 MED ORDER — CHLORDIAZEPOXIDE HCL 25 MG PO CAPS: 25.0000 mg | ORAL_CAPSULE | Freq: Three times a day (TID) | ORAL | Status: DC | PRN

## 2014-05-10 MED ORDER — LORAZEPAM 1 MG PO TABS: 0.0000 mg | ORAL_TABLET | Freq: Two times a day (BID) | ORAL | Status: DC

## 2014-05-10 NOTE — ED Notes (Addendum)
Pt requesting detox from [facility], pt states he has consumed 2-3 40's and a pint of liquor today.  Pt c/o generalized body aches and muscle spasms.

## 2014-05-10 NOTE — Discharge Instructions (Signed)
Rest. Drink plenty of fluids. Avoid any alcohol use. Take librium as need - may make drowsy, no driving if/when taking. Follow up with AA, and use resource guide provided for additional community resources for alcohol and substance abuse rehab. Also follow up with primary care doctor in coming week. Return to ER if worse, new symptoms, medical emergency, other concern.    Alcohol Intoxication Alcohol intoxication occurs when the amount of alcohol that a person has consumed impairs his or her ability to mentally and physically function. Alcohol directly impairs the normal chemical activity of the brain. Drinking large amounts of alcohol can lead to changes in mental function and behavior, and it can cause many physical effects that can be harmful.  Alcohol intoxication can range in severity from mild to very severe. Various factors can affect the level of intoxication that occurs, such as the person's age, gender, weight, frequency of alcohol consumption, and the presence of other medical conditions (such as diabetes, seizures, or heart conditions). Dangerous levels of alcohol intoxication may occur when people drink large amounts of alcohol in a short period (binge drinking). Alcohol can also be especially dangerous when combined with certain prescription medicines or "recreational" drugs. SIGNS AND SYMPTOMS Some common signs and symptoms of mild alcohol intoxication include:  Loss of coordination.  Changes in mood and behavior.  Impaired judgment.  Slurred speech. As alcohol intoxication progresses to more severe levels, other signs and symptoms will appear. These may include:  Vomiting.  Confusion and impaired memory.  Slowed breathing.  Seizures.  Loss of consciousness. DIAGNOSIS  Your health care provider will take a medical history and perform a physical exam. You will be asked about the amount and type of alcohol you have consumed. Blood tests will be done to measure the  concentration of alcohol in your blood. In many places, your blood alcohol level must be lower than 80 mg/dL (6.96%) to legally drive. However, many dangerous effects of alcohol can occur at much lower levels.  TREATMENT  People with alcohol intoxication often do not require treatment. Most of the effects of alcohol intoxication are temporary, and they go away as the alcohol naturally leaves the body. Your health care provider will monitor your condition until you are stable enough to go home. Fluids are sometimes given through an IV access tube to help prevent dehydration.  HOME CARE INSTRUCTIONS  Do not drive after drinking alcohol.  Stay hydrated. Drink enough water and fluids to keep your urine clear or pale yellow. Avoid caffeine.   Only take over-the-counter or prescription medicines as directed by your health care provider.  SEEK MEDICAL CARE IF:   You have persistent vomiting.   You do not feel better after a few days.  You have frequent alcohol intoxication. Your health care provider can help determine if you should see a substance use treatment counselor. SEEK IMMEDIATE MEDICAL CARE IF:   You become shaky or tremble when you try to stop drinking.   You shake uncontrollably (seizure).   You throw up (vomit) blood. This may be bright red or may look like black coffee grounds.   You have blood in your stool. This may be bright red or may appear as a black, tarry, bad smelling stool.   You become lightheaded or faint.  MAKE SURE YOU:   Understand these instructions.  Will watch your condition.  Will get help right away if you are not doing well or get worse. Document Released: 07/26/2005 Document Revised: 06/18/2013 Document  Reviewed: 03/21/2013 [employer] Patient Information 2015 [place], [place]. This information is not intended to replace advice given to you by your health care provider. Make sure you discuss any questions you have with your health care  provider.      Alcohol Use Disorder Alcohol use disorder is a mental disorder. It is not a one-time incident of heavy drinking. Alcohol use disorder is the excessive and uncontrollable use of alcohol over time that leads to problems with functioning in one or more areas of daily living. People with this disorder risk harming themselves and others when they drink to excess. Alcohol use disorder also can cause other mental disorders, such as mood and anxiety disorders, and serious physical problems. People with alcohol use disorder often misuse other drugs.  Alcohol use disorder is common and widespread. Some people with this disorder drink alcohol to cope with or escape from negative life events. Others drink to relieve chronic pain or symptoms of mental illness. People with a family history of alcohol use disorder are at higher risk of losing control and using alcohol to excess.  SYMPTOMS  Signs and symptoms of alcohol use disorder may include the following:   Consumption ofalcohol inlarger amounts or over a longer period of time than intended.  Multiple unsuccessful attempts to cutdown or control alcohol use.   A great deal of time spent obtaining alcohol, using alcohol, or recovering from the effects of alcohol (hangover).  A strong desire or urge to use alcohol (cravings).   Continued use of alcohol despite problems at work, school, or home because of alcohol use.   Continued use of alcohol despite problems in relationships because of alcohol use.  Continued use of alcohol in situations when it is physically hazardous, such as driving a car.  Continued use of alcohol despite awareness of a physical or psychological problem that is likely related to alcohol use. Physical problems related to alcohol use can involve the brain, heart, liver, stomach, and intestines. Psychological problems related to alcohol use include intoxication, depression, anxiety, psychosis, delirium, and  dementia.   The need for increased amounts of alcohol to achieve the same desired effect, or a decreased effect from the consumption of the same amount of alcohol (tolerance).  Withdrawal symptoms upon reducing or stopping alcohol use, or alcohol use to reduce or avoid withdrawal symptoms. Withdrawal symptoms include:  Racing heart.  Hand tremor.  Difficulty sleeping.  Nausea.  Vomiting.  Hallucinations.  Restlessness.  Seizures. DIAGNOSIS Alcohol use disorder is diagnosed through an assessment by your caregiver. Your caregiver may start by asking three or four questions to screen for excessive or problematic alcohol use. To confirm a diagnosis of alcohol use disorder, at least two symptoms (see SYMPTOMS) must be present within a 7-month period. The severity of alcohol use disorder depends on the number of symptoms:  Mild--two or three.  Moderate--four or five.  Severe--six or more. Your caregiver may perform a physical exam or use results from lab tests to see if you have physical problems resulting from alcohol use. Your caregiver may refer you to a mental health professional for evaluation. TREATMENT  Some people with alcohol use disorder are able to reduce their alcohol use to low-risk levels. Some people with alcohol use disorder need to quit drinking alcohol. When necessary, mental health professionals with specialized training in substance use treatment can help. Your caregiver can help you decide how severe your alcohol use disorder is and what type of treatment you need. The following forms  of treatment are available:   Detoxification. Detoxification involves the use of prescription medication to prevent alcohol withdrawal symptoms in the first week after quitting. This is important for people with a history of symptoms of withdrawal and for heavy drinkers who are likely to have withdrawal symptoms. Alcohol withdrawal can be dangerous and, in severe cases, cause death.  Detoxification is usually provided in a hospital or in-patient substance use treatment facility.  Counseling or talk therapy. Talk therapy is provided by substance use treatment counselors. It addresses the reasons people use alcohol and ways to keep them from drinking again. The goals of talk therapy are to help people with alcohol use disorder find healthy activities and ways to cope with life stress, to identify and avoid triggers for alcohol use, and to handle cravings, which can cause relapse.  Medication.Different medications can help treat alcohol use disorder through the following actions:  Decrease alcohol cravings.  Decrease the positive reward response felt from alcohol use.  Produce an uncomfortable physical reaction when alcohol is used (aversion therapy).  Support groups. Support groups are run by people who have quit drinking. They provide emotional support, advice, and guidance. These forms of treatment are often combined. Some people with alcohol use disorder benefit from intensive combination treatment provided by specialized substance use treatment centers. Both inpatient and outpatient treatment programs are available. Document Released: 11/23/2004 Document Revised: 06/18/2013 Document Reviewed: 01/23/2013 [facility] Patient Information 2015 [place], [place]. This information is not intended to replace advice given to you by your health care provider. Make sure you discuss any questions you have with your health care provider.     Alcohol Problems Most adults who drink alcohol drink in moderation (not a lot) are at low risk for developing problems related to their drinking. However, all drinkers, including low-risk drinkers, should know about the health risks connected with drinking alcohol. RECOMMENDATIONS FOR LOW-RISK DRINKING  Drink in moderation. Moderate drinking is defined as follows:   Men - no more than 2 drinks per day.  Nonpregnant women - no more than 1 drink  per day.  Over age 30 - no more than 1 drink per day. A standard drink is 12 grams of pure alcohol, which is equal to a 12 ounce bottle of beer or wine cooler, a 5 ounce glass of wine, or 1.5 ounces of distilled spirits (such as whiskey, brandy, vodka, or rum).  ABSTAIN FROM (DO NOT DRINK) ALCOHOL:  When pregnant or considering pregnancy.  When taking a medication that interacts with alcohol.  If you are alcohol dependent.  A medical condition that prohibits drinking alcohol (such as ulcer, liver disease, or heart disease). DISCUSS WITH YOUR CAREGIVER:  If you are at risk for coronary heart disease, discuss the potential benefits and risks of alcohol use: Light to moderate drinking is associated with lower rates of coronary heart disease in certain populations (for example, men over age 65 and postmenopausal women). Infrequent or nondrinkers are advised not to begin light to moderate drinking to reduce the risk of coronary heart disease so as to avoid creating an alcohol-related problem. Similar protective effects can likely be gained through proper diet and exercise.  Women and the elderly have smaller amounts of body water than men. As a result women and the elderly achieve a higher blood alcohol concentration after drinking the same amount of alcohol.  Exposing a fetus to alcohol can cause a broad range of birth defects referred to as Fetal Alcohol Syndrome (FAS) or Alcohol-Related Birth  Defects (ARBD). Although FAS/ARBD is connected with excessive alcohol consumption during pregnancy, studies also have reported neurobehavioral problems in infants born to mothers reporting drinking an average of 1 drink per day during pregnancy.  Heavier drinking (the consumption of more than 4 drinks per occasion by men and more than 3 drinks per occasion by women) impairs learning (cognitive) and psychomotor functions and increases the risk of alcohol-related problems, including accidents and  injuries. CAGE QUESTIONS:   Have you ever felt that you should Cut down on your drinking?  Have people Annoyed you by criticizing your drinking?  Have you ever felt bad or Guilty about your drinking?  Have you ever had a drink first thing in the morning to steady your nerves or get rid of a hangover (Eye opener)? If you answered positively to any of these questions: You may be at risk for alcohol-related problems if alcohol consumption is:   Men: Greater than 14 drinks per week or more than 4 drinks per occasion.  Women: Greater than 7 drinks per week or more than 3 drinks per occasion. Do you or your family have a medical history of alcohol-related problems, such as:  Blackouts.  Sexual dysfunction.  Depression.  Trauma.  Liver dysfunction.  Sleep disorders.  Hypertension.  Chronic abdominal pain.  Has your drinking ever caused you problems, such as problems with your family, problems with your work (or school) performance, or accidents/injuries?  Do you have a compulsion to drink or a preoccupation with drinking?  Do you have poor control or are you unable to stop drinking once you have started?  Do you have to drink to avoid withdrawal symptoms?  Do you have problems with withdrawal such as tremors, nausea, sweats, or mood disturbances?  Does it take more alcohol than in the past to get you high?  Do you feel a strong urge to drink?  Do you change your plans so that you can have a drink?  Do you ever drink in the morning to relieve the shakes or a hangover? If you have answered a number of the previous questions positively, it may be time for you to talk to your caregivers, family, and friends and see if they think you have a problem. Alcoholism is a chemical dependency that keeps getting worse and will eventually destroy your health and relationships. Many alcoholics end up dead, impoverished, or in prison. This is often the end result of all chemical  dependency.  Do not be discouraged if you are not ready to take action immediately.  Decisions to change behavior often involve up and down desires to change and feeling like you cannot decide.  Try to think more seriously about your drinking behavior.  Think of the reasons to quit. WHERE TO GO FOR ADDITIONAL INFORMATION   The National Institute on Alcohol Abuse and Alcoholism (NIAAA) BasicStudents.dk  [employer] on Alcoholism and Drug Dependence (NCADD) www.ncadd.org  American Society of Addiction Medicine (ASAM) RoyalDiary.gl  Document Released: 10/16/2005 Document Revised: 01/08/2012 Document Reviewed: 06/03/2008 [facility] Patient Information 2015 [place], [place]. This information is not intended to replace advice given to you by your health care provider. Make sure you discuss any questions you have with your health care provider.      Alcohol Withdrawal Anytime drug use is interfering with normal living activities it has become abuse. This includes problems with family and friends. Psychological dependence has developed when your mind tells you that the drug is needed. This is usually followed by physical  dependence when a continuing increase of drugs are required to get the same feeling or "high." This is known as addiction or chemical dependency. A person's risk is much higher if there is a history of chemical dependency in the family. Mild Withdrawal Following Stopping Alcohol, When Addiction or Chemical Dependency Has Developed When a person has developed tolerance to alcohol, any sudden stopping of alcohol can cause uncomfortable physical symptoms. Most of the time these are mild and consist of tremors in the hands and increases in heart rate, breathing, and temperature. Sometimes these symptoms are associated with anxiety, panic attacks, and bad dreams. There may also be stomach upset. Normal sleep patterns are often interrupted with periods of inability to sleep  (insomnia). This may last for 6 months. Because of this discomfort, many people choose to continue drinking to get rid of this discomfort and to try to feel normal. Severe Withdrawal with Decreased or No Alcohol Intake, When Addiction or Chemical Dependency Has Developed About five percent of alcoholics will develop signs of severe withdrawal when they stop using alcohol. One sign of this is development of generalized seizures (convulsions). Other signs of this are severe agitation and confusion. This may be associated with believing in things which are not real or seeing things which are not really there (delusions and hallucinations). Vitamin deficiencies are usually present if alcohol intake has been long-term. Treatment for this most often requires hospitalization and close observation. Addiction can only be helped by stopping use of all chemicals. This is hard but may save your life. With continual alcohol use, possible outcomes are usually loss of self respect and esteem, violence, and death. Addiction cannot be cured but it can be stopped. This often requires outside help and the care of professionals. Treatment centers are listed in the yellow pages under Cocaine, Narcotics, and Alcoholics Anonymous. Most hospitals and clinics can refer you to a specialized care center. It is not necessary for you to go through the uncomfortable symptoms of withdrawal. Your caregiver can provide you with medicines that will help you through this difficult period. Try to avoid situations, friends, or drugs that made it possible for you to keep using alcohol in the past. Learn how to say no. It takes a long period of time to overcome addictions to all drugs, including alcohol. There may be many times when you feel as though you want a drink. After getting rid of the physical addiction and withdrawal, you will have a lessening of the craving which tells you that you need alcohol to feel normal. Call your caregiver if more  support is needed. Learn who to talk to in your family and among your friends so that during these periods you can receive outside help. Alcoholics Anonymous (AA) has helped many people over the years. To get further help, contact AA or call your caregiver, counselor, or clergyperson. Al-Anon and Alateen are support groups for friends and family members of an alcoholic. The people who love and care for an alcoholic often need help, too. For information about these organizations, check your phone directory or call a local alcoholism treatment center.  SEEK IMMEDIATE MEDICAL CARE IF:   You have a seizure.  You have a fever.  You experience uncontrolled vomiting or you vomit up blood. This may be bright red or look like black coffee grounds.  You have blood in the stool. This may be bright red or appear as a black, tarry, bad-smelling stool.  You become lightheaded or faint. Do  not drive if you feel this way. Have someone else drive you or call 811 for help.  You become more agitated or confused.  You develop uncontrolled anxiety.  You begin to see things that are not really there (hallucinate). Your caregiver has determined that you completely understand your medical condition, and that your mental state is back to normal. You understand that you have been treated for alcohol withdrawal, have agreed not to drink any alcohol for a minimum of 1 day, will not operate a car or other machinery for 24 hours, and have had an opportunity to ask any questions about your condition. Document Released: 07/26/2005 Document Revised: 01/08/2012 Document Reviewed: 06/03/2008 [facility] Patient Information 2015 [place], [place]. This information is not intended to replace advice given to you by your health care provider. Make sure you discuss any questions you have with your health care provider.      Emergency Department Resource Guide 1) Find a Doctor and Pay Out of Pocket Although you won't have to find out  who is covered by your insurance plan, it is a good idea to ask around and get recommendations. You will then need to call the office and see if the doctor you have chosen will accept you as a new patient and what types of options they offer for patients who are self-pay. Some doctors offer discounts or will set up payment plans for their patients who do not have insurance, but you will need to ask so you aren't surprised when you get to your appointment.  2) Contact Your Local Health Department Not all health departments have doctors that can see patients for sick visits, but many do, so it is worth a call to see if yours does. If you don't know where your local health department is, you can check in your phone book. The CDC also has a tool to help you locate your state's health department, and many state websites also have listings of all of their local health departments.  3) Find a Walk-in Clinic If your illness is not likely to be very severe or complicated, you may want to try a walk in clinic. These are popping up all over the country in pharmacies, drugstores, and shopping centers. They're usually staffed by nurse practitioners or physician assistants that have been trained to treat common illnesses and complaints. They're usually fairly quick and inexpensive. However, if you have serious medical issues or chronic medical problems, these are probably not your best option.  No Primary Care Doctor: - Call [facility] at  [phone] - they can help you locate a primary care doctor that  accepts your insurance, provides certain services, etc. - Physician Referral Service- [phone]  Chronic Pain Problems: Organization         Address  Phone   Notes  [facility]  [phone] Patients need to be referred by their primary care doctor.   Medication Assistance: Organization         Address  Phone   Notes  [facility] Medication [facility] [address]  [place] [place], [place] [address] [phone] --Must be a resident of [facility] -- Must have NO insurance coverage whatsoever (no Medicaid/ Medicare, etc.) -- The pt. MUST have a primary care doctor that directs their care regularly and follows them in the community   [facility]  [phone]   [employer]  [phone]    Agencies that provide inexpensive medical care: Organization  Address  Phone   Notes  [facility]  [phone]   [facility]    [phone]   [facility] [address] [place], [place] [address] [phone]   [facility] [address] [place] [place] [phone]   [facility]    [phone]   [facility]    [phone]   [facility]  [address], [place] Phone:  [phone], Fax:  [phone] Hours of Operation:  9 am - 6 pm, M-F.  Also accepts Medicaid/Medicare and self-pay.  [facility]  [address], [place] Phone: [phone], Fax: [phone]. Hours of Operation:  8:30 am - 5:30 pm, M-F.  Also accepts Medicaid and self-pay.  [facility] [place] [address], [place] Phone: [phone]   [facility] [address] Natasha Bence [place], [place] [phone], Ext. [phone] Mondays & Thursdays: 7-9 AM.  First 15 patients are seen on a first come, first serve basis.    Medicaid-accepting [facility] Providers:  Organization         Address  Phone   Notes  [facility] [address],  [phone] Also accepts self-pay patients.  [facility] [address] Laurell Josephs [place], [place]  [phone]   [facility] [address], [place] [phone]   [facility] [address], [place] [phone]   Renaye Rakers  [address], [place]   [phone] Only accepts [place] patients after they have their name applied to their card.   Self-Pay (no insurance) in [facility]:  Organization         Address  Phone   Notes  Sickle Cell Patients, [facility] [address] [place], [place] [phone]   [facility] [address] [place], [place] [phone]   [facility] [place]  [address], [place] [phone]   [facility]/Dr. Osei-Bonsu  [address] [place] or [address], [place] [phone] Phone number for both [place] [place] locations is the same.  [facility] [address], [place] [phone]   [facility] [address] [place] or [address] [phone]   [facility] [address] [place] [phone], phone; [phone], fax Sees patients 1st and 3rd Saturday of every month.  Must not qualify for public or private insurance (i.e. Medicaid, Medicare, [place] Health Choice, Veterans' Benefits)  Household income should be no more than 200% of the poverty level The clinic cannot treat you if you are pregnant or think you are pregnant  Sexually transmitted diseases are not treated at the clinic.    Dental Care: Organization         Address  Phone  Notes  [facility] Department of [facility] [address] [place], [place] [phone] Accepts children up to age 37 who are enrolled in [place] or [place] Health Choice; pregnant women with a Medicaid card; and children who have applied for Medicaid or [place] Health Choice, but were declined, whose parents can pay a reduced fee at time of  service.  [facility] Department of [facility]  [address], [place] [phone] Accepts children up to age 57  who are enrolled in [place] or [place] Health Choice; pregnant women with a Medicaid card; and children who have applied for Medicaid or [place] Health Choice, but were declined, whose parents can pay a reduced fee at time of service.  [facility]  [address] [place], [place] [phone] Patients are seen by appointment only. Walk-ins are not accepted. [facility] will see patients 84 years of age and older. Monday - Tuesday (8am-5pm) Most Wednesdays (8:30-5pm) $30 per visit, cash only  [facility]  [address], [facility] [phone] Patients are seen by appointment only. Walk-ins are not accepted. [facility] will see patients 35 years of age and older. One Wednesday Evening (Monthly: Volunteer Based).  $30 per visit, cash only  [employer]  [phone] for adults; Children under age 90, call Graduate Pediatric Dentistry at [phone]. Children aged 43-14, please call [phone] to request a pediatric application.  Dental services are provided in all areas of dental care including fillings, crowns and bridges, complete and partial dentures, implants, gum treatment, root canals, and extractions. Preventive care is also provided. Treatment is provided to both adults and children. Patients are selected via a lottery and there is often a waiting list.   [facility] [address], [place]  [phone] [email or web address]   [facility] [address] [place], [place] [phone], Ext. [phone] Second and Fourth Thursday of each month, opens at 6:30 AM; Clinic ends at 9 AM.  Patients are seen on a first-come first-served basis, and a limited number are seen during each clinic.   [facility]  [address] [place], [place] [phone]   Eligibility Requirements You must have lived in [place], [place], or [place] counties for at least the last three months.    You cannot be eligible for state or federal sponsored [place], including [employer], [place], or [employer].   You generally cannot be eligible for healthcare insurance through your employer.    How to apply: Eligibility screenings are held every Tuesday and Wednesday afternoon from 1:00 pm until 4:00 pm. You do not need an appointment for the interview!  [facility] [address], [place], [place] [phone]   [facility]  [phone]   [facility]  [phone]   [facility]  [phone]    Behavioral Health Resources in the Community: Intensive Outpatient Programs Organization         Address  Phone  Notes  [facility] [address], [place], [place] [phone]   [facility] [address] [place], [place] [phone]   [facility] [address] [place], [place]  [phone]   [facility] [address]  [place], [place] [phone] or [phone]   Substance Abuse Resources Organization         Address  Phone  Notes  [facility]  [phone]   [facility]  [phone]   [facility]  [phone]   Floydene Flock  [phone]   Residential & Outpatient Substance Abuse Program  [phone]   Psychological Services Organization         Address  Phone  Notes  °[facility]  336- [phone]   °[facility]  336- [phone]   °[facility] [address], [place] 1-800-853-5163 or [phone]   ° °Mobile Crisis Teams °Organization         Address  Phone  Notes  °[facility], Mobile Crisis Care Unit  1-877-626-1772   °Assertive °[facility] ° [address] [place], [place] [phone]   °Sharon DeEsch [address] °[place] [phone]   ° °Self-Help/Support  Groups °Organization         Address  Phone             Notes  °[facility] [place] - variety of support groups  336- [phone] Call for more information  °Narcotics Anonymous (NA), [facility] [address], °[place]  2 meetings at this location  ° °Residential Treatment Programs °Organization         Address  Phone  Notes  °[facility] [address],    °[place]  [phone]   °[facility] ° [address], [place], [place] [phone]   °[facility] [address], [place] [phone] Admissions: 8am-3pm M-F  °[facility] [address]    °[place], [place] [phone]   °[facility] [address], [place], [place] [phone]   °[facility] [address]  °[place], [place] 336-285-9073   °[facility] - Intensive Outpatient [address], [place], [place] 336-852-3033   °[facility] ([facility] [address]  °[place], [place] 1-877-615-2722 or 336-784-9470   °[facility] ([facility]) [address] [place], [place] [phone] Accepts Medicaid  °[facility] [address]  °[place] 1-800-659-3381 Substance Abuse/Addiction Treatment  ° °[place] [facility] Resources °Organization         Address  Phone  Notes  °[facility]  (888) [phone]   °Julie Brannon, PhD [address] [place], [place]   (336) [phone] or (336) 951-0000   °[facility]   [address] °[place], [place] (336) 349-4454   °[facility] [address], [place], [place] (336) [phone] Insurance/Medicaid/sponsorship through [facility]  °[facility] [address]                                    [place], [place] (336) [phone] Therapy/tele-psych/case  °[facility] [address]  ° [place], [place] (336) [phone]    °Dr. Arfeen  (336) [phone]   °[facility] [place]  [facility]  [employer] [place] [facility] 1) [address], [place] °2) [address], [place] °3)  [address], [place] (336) 349-3220 °(336) 342-7768 ° °(336) [phone]   °[place] Child Abuse Hotline (336) 342-1394 or (336) 342-3537 (After Hours)    ° ° ° °

## 2014-05-10 NOTE — ED Provider Notes (Addendum)
CSN: [id number]     Arrival date & time 05/10/14  0232 History   First MD Initiated Contact with Patient 05/10/14 0700     Chief Complaint  Patient presents with  . detox      (Consider location/radiation/quality/duration/timing/severity/associated sxs/prior Treatment) The history is provided by the patient.  pt c/o hx etoh abuse, in past couple weeks daily etoh abuse, and wanting rehab/detox.  States when stops drinking will feel shaky, but denies hx seizures, complicated etoh withdrawal, or dts.   States drinks heavily but cant quantify amt, last drank early this morning.  +ecstacy abuse, denies other drug use. Denies OD.  States physical health at baseline, no recent illness. Pt denies depression or thoughts of harm to self or others. Denies being through any recent rehab or detox program.     Past Medical History  Diagnosis Date  . No significant medical problems   . Liver damage   . Alcohol abuse    History reviewed. No pertinent past surgical history. No family history on file. History  Substance Use Topics  . Smoking status: Current Every Day Smoker -- 0.50 packs/day    Types: Cigarettes  . Smokeless tobacco: Never Used  . Alcohol Use: Yes     Comment: heavy    Review of Systems  Constitutional: Negative for fever and chills.  HENT: Negative for sore throat.   Eyes: Negative for redness.  Respiratory: Negative for cough and shortness of breath.   Cardiovascular: Negative for chest pain.  Gastrointestinal: Negative for vomiting, abdominal pain and diarrhea.  Genitourinary: Negative for flank pain.  Musculoskeletal: Negative for back pain and neck pain.  Skin: Negative for rash.  Neurological: Negative for headaches.  Hematological: Does not bruise/bleed easily.  Psychiatric/Behavioral: Negative for suicidal ideas, confusion and dysphoric mood.      Allergies  Other; Peanut butter flavor; and Peanut-containing drug products  Home Medications   Prior to  Admission medications   Medication Sig Start Date End Date Taking? Authorizing Provider  Tetrahydrozoline HCl (VISINE OP) Apply 2 drops to eye as needed. For dryness   Yes Historical Provider, MD   BP 120/80  Pulse 87  Temp(Src) 97.9 F (36.6 C) (Oral)  Resp 16  SpO2 99% Physical Exam  Nursing note and vitals reviewed. Constitutional: He is oriented to person, place, and time. He appears well-developed and well-nourished. No distress.  HENT:  Head: Atraumatic.  Mouth/Throat: Oropharynx is clear and moist.  Eyes: Conjunctivae are normal. Pupils are equal, round, and reactive to light. No scleral icterus.  Neck: Normal range of motion. Neck supple. No tracheal deviation present.  Cardiovascular: Normal rate, regular rhythm, normal heart sounds and intact distal pulses.   Pulmonary/Chest: Effort normal and breath sounds normal. No accessory muscle usage. No respiratory distress. He exhibits no tenderness.  Abdominal: Soft. Bowel sounds are normal. He exhibits no distension. There is no tenderness.  Musculoskeletal: Normal range of motion. He exhibits no edema and no tenderness.  Neurological: He is alert and oriented to person, place, and time.  Steady gait. No tremor or shakes.   Skin: Skin is warm and dry. He is not diaphoretic.  Psychiatric: He has a normal mood and affect.    ED Course  Procedures (including critical care time) Labs Review  Results for orders placed during the hospital encounter of 05/10/14  CBC WITH DIFFERENTIAL      Result Value Ref Range   WBC 6.0  4.0 - 10.5 K/uL   RBC 4.77  4.22 - 5.81 MIL/uL   Hemoglobin 15.9  13.0 - 17.0 g/dL   HCT 16.144.2  09.639.0 - 04.552.0 %   MCV 92.7  78.0 - 100.0 fL   MCH 33.3  26.0 - 34.0 pg   MCHC 36.0  30.0 - 36.0 g/dL   RDW 40.912.3  81.111.5 - 91.415.5 %   Platelets 227  150 - 400 K/uL   Neutrophils Relative % 45  43 - 77 %   Neutro Abs 2.7  1.7 - 7.7 K/uL   Lymphocytes Relative 44  12 - 46 %   Lymphs Abs 2.6  0.7 - 4.0 K/uL   Monocytes  Relative 8  3 - 12 %   Monocytes Absolute 0.5  0.1 - 1.0 K/uL   Eosinophils Relative 2  0 - 5 %   Eosinophils Absolute 0.1  0.0 - 0.7 K/uL   Basophils Relative 1  0 - 1 %   Basophils Absolute 0.1  0.0 - 0.1 K/uL  COMPREHENSIVE METABOLIC PANEL      Result Value Ref Range   Sodium 139  137 - 147 mEq/L   Potassium 3.6 (*) 3.7 - 5.3 mEq/L   Chloride 98  96 - 112 mEq/L   CO2 22  19 - 32 mEq/L   Glucose, Bld 110 (*) 70 - 99 mg/dL   BUN 6  6 - 23 mg/dL   Creatinine, Ser 7.820.71  0.50 - 1.35 mg/dL   Calcium 9.4  8.4 - 95.610.5 mg/dL   Total Protein 8.6 (*) 6.0 - 8.3 g/dL   Albumin 4.4  3.5 - 5.2 g/dL   AST 94 (*) 0 - 37 U/L   ALT 73 (*) 0 - 53 U/L   Alkaline Phosphatase 89  39 - 117 U/L   Total Bilirubin 1.2  0.3 - 1.2 mg/dL   GFR calc non Af Amer >90  >90 mL/min   GFR calc Af Amer >90  >90 mL/min   Anion gap 19 (*) 5 - 15  ETHANOL      Result Value Ref Range   Alcohol, Ethyl (B) 286 (*) 0 - 11 mg/dL      MDM  Labs. Psych team consulted.  ciwa protocol.  Reviewed nursing notes and prior charts for additional history.   Recheck pt alert, content, nad.  No current inpatient rehab/detox beds available.  Pt w no current tremor or shakes. Vitals normal.  Pt appears stable for d/c w resource guide provide re outpatient rehab/detox options.     Suzi RootsKevin E Aneya Daddona, MD 05/10/14 [phone]

## 2014-05-10 NOTE — ED Notes (Signed)
Pt calm and cooperative, eating breakfast, expressing desire for sleep due to difficulty w/ insomnia.

## 2014-05-10 NOTE — ED Notes (Signed)
Pt transported from home with request for etoh and ecstasy detox. C/o body aches, spasms

## 2014-05-10 NOTE — ED Notes (Signed)
Unable to locate pt in [facility].

## 2014-05-10 NOTE — ED Notes (Signed)
Patient and belongings have been wanded by security, they are in locker 30

## 2014-06-10 ENCOUNTER — Encounter (HOSPITAL_COMMUNITY): Payer: Self-pay | Admitting: *Deleted

## 2014-06-10 ENCOUNTER — Encounter (HOSPITAL_COMMUNITY): Payer: Self-pay | Admitting: Emergency Medicine

## 2014-06-10 ENCOUNTER — Emergency Department (HOSPITAL_COMMUNITY)
Admission: EM | Admit: 2014-06-10 | Discharge: 2014-06-10 | Disposition: A | Discharge status: Other Acute Inpatient Hospital | Payer: PRIVATE HEALTH INSURANCE | Attending: Emergency Medicine | Admitting: Emergency Medicine

## 2014-06-10 ENCOUNTER — Inpatient Hospital Stay (HOSPITAL_COMMUNITY)
Admission: AD | Admit: 2014-06-10 | Discharge: 2014-06-12 | DRG: 897 | Disposition: A | Discharge status: Home / Self Care | Payer: No Typology Code available for payment source | Source: Intra-hospital | Attending: Psychiatry | Admitting: Psychiatry

## 2014-06-10 DIAGNOSIS — F1020 Alcohol dependence, uncomplicated: Principal | ICD-10-CM | POA: Diagnosis present

## 2014-06-10 DIAGNOSIS — Z8719 Personal history of other diseases of the digestive system: Secondary | ICD-10-CM | POA: Insufficient documentation

## 2014-06-10 DIAGNOSIS — F1994 Other psychoactive substance use, unspecified with psychoactive substance-induced mood disorder: Secondary | ICD-10-CM | POA: Diagnosis present

## 2014-06-10 DIAGNOSIS — F321 Major depressive disorder, single episode, moderate: Secondary | ICD-10-CM | POA: Diagnosis present

## 2014-06-10 DIAGNOSIS — F4310 Post-traumatic stress disorder, unspecified: Secondary | ICD-10-CM | POA: Diagnosis present

## 2014-06-10 DIAGNOSIS — F17200 Nicotine dependence, unspecified, uncomplicated: Secondary | ICD-10-CM | POA: Diagnosis present

## 2014-06-10 DIAGNOSIS — F1010 Alcohol abuse, uncomplicated: Secondary | ICD-10-CM

## 2014-06-10 DIAGNOSIS — G4700 Insomnia, unspecified: Secondary | ICD-10-CM | POA: Diagnosis present

## 2014-06-10 DIAGNOSIS — F10230 Alcohol dependence with withdrawal, uncomplicated: Secondary | ICD-10-CM

## 2014-06-10 DIAGNOSIS — F1210 Cannabis abuse, uncomplicated: Secondary | ICD-10-CM | POA: Insufficient documentation

## 2014-06-10 LAB — RAPID URINE DRUG SCREEN, HOSP PERFORMED
Amphetamines: NOT DETECTED
Barbiturates: NOT DETECTED
Benzodiazepines: NOT DETECTED
Cocaine: NOT DETECTED
Opiates: NOT DETECTED
Tetrahydrocannabinol: POSITIVE — AB

## 2014-06-10 LAB — CBC
HEMATOCRIT: 45.4 % (ref 39.0–52.0)
HEMOGLOBIN: 16.1 g/dL (ref 13.0–17.0)
MCH: 33.3 pg (ref 26.0–34.0)
MCHC: 35.5 g/dL (ref 30.0–36.0)
MCV: 94 fL (ref 78.0–100.0)
Platelets: 286 10*3/uL (ref 150–400)
RBC: 4.83 MIL/uL (ref 4.22–5.81)
RDW: 13.1 % (ref 11.5–15.5)
WBC: 5.8 10*3/uL (ref 4.0–10.5)

## 2014-06-10 LAB — COMPREHENSIVE METABOLIC PANEL
ALT: 44 U/L (ref 0–53)
AST: 57 U/L — ABNORMAL HIGH (ref 0–37)
Albumin: 4.3 g/dL (ref 3.5–5.2)
Alkaline Phosphatase: 81 U/L (ref 39–117)
Anion gap: 18 — ABNORMAL HIGH (ref 5–15)
BILIRUBIN TOTAL: 0.6 mg/dL (ref 0.3–1.2)
BUN: 9 mg/dL (ref 6–23)
CALCIUM: 8.8 mg/dL (ref 8.4–10.5)
CHLORIDE: 103 meq/L (ref 96–112)
CO2: 22 meq/L (ref 19–32)
CREATININE: 0.84 mg/dL (ref 0.50–1.35)
GLUCOSE: 106 mg/dL — AB (ref 70–99)
Potassium: 3.6 mEq/L — ABNORMAL LOW (ref 3.7–5.3)
Sodium: 143 mEq/L (ref 137–147)
Total Protein: 8.5 g/dL — ABNORMAL HIGH (ref 6.0–8.3)

## 2014-06-10 LAB — SALICYLATE LEVEL

## 2014-06-10 LAB — ETHANOL
ALCOHOL ETHYL (B): 401 mg/dL — AB (ref 0–11)
Alcohol, Ethyl (B): 305 mg/dL — ABNORMAL HIGH (ref 0–11)
Alcohol, Ethyl (B): 147 mg/dL — ABNORMAL HIGH (ref 0–11)

## 2014-06-10 LAB — ACETAMINOPHEN LEVEL: Acetaminophen (Tylenol), Serum: <15 ug/mL (ref 10–30)

## 2014-06-10 MED ORDER — LOPERAMIDE HCL 2 MG PO CAPS: 2.0000 mg | ORAL_CAPSULE | ORAL | Status: DC | PRN

## 2014-06-10 MED ORDER — CHLORDIAZEPOXIDE HCL 25 MG PO CAPS
25.0000 mg | ORAL_CAPSULE | Freq: Four times a day (QID) | ORAL | Status: DC
  Administered 2014-06-10 – 2014-06-11 (×2): 25 mg via ORAL
  Filled 2014-06-10 (×2): qty 1

## 2014-06-10 MED ORDER — TRAZODONE HCL 50 MG PO TABS
50.0000 mg | ORAL_TABLET | Freq: Every evening | ORAL | Status: DC | PRN
  Administered 2014-06-10 – 2014-06-11 (×2): 50 mg via ORAL
  Filled 2014-06-10: qty 28
  Filled 2014-06-10 (×2): qty 1
  Filled 2014-06-10: qty 28
  Filled 2014-06-10 (×4): qty 1

## 2014-06-10 MED ORDER — HYDROXYZINE HCL 25 MG PO TABS
25.0000 mg | ORAL_TABLET | Freq: Four times a day (QID) | ORAL | Status: DC | PRN
Start: 2014-06-10 — End: 2014-06-12
  Administered 2014-06-11: 25 mg via ORAL
  Filled 2014-06-10: qty 1

## 2014-06-10 MED ORDER — ACETAMINOPHEN 325 MG PO TABS: 650.0000 mg | ORAL_TABLET | Freq: Four times a day (QID) | ORAL | Status: DC | PRN

## 2014-06-10 MED ORDER — CHLORDIAZEPOXIDE HCL 25 MG PO CAPS: 25.0000 mg | ORAL_CAPSULE | Freq: Three times a day (TID) | ORAL | Status: DC

## 2014-06-10 MED ORDER — THIAMINE HCL 100 MG/ML IJ SOLN
100.0000 mg | Freq: Once | INTRAMUSCULAR | Status: AC
  Administered 2014-06-10: 100 mg via INTRAMUSCULAR
  Filled 2014-06-10: qty 2

## 2014-06-10 MED ORDER — CHLORDIAZEPOXIDE HCL 25 MG PO CAPS
50.0000 mg | ORAL_CAPSULE | Freq: Three times a day (TID) | ORAL | Status: DC | PRN
  Administered 2014-06-10: 50 mg via ORAL
  Filled 2014-06-10: qty 2

## 2014-06-10 MED ORDER — MAGNESIUM HYDROXIDE 400 MG/5ML PO SUSP: 30.0000 mL | Freq: Every day | ORAL | Status: DC | PRN

## 2014-06-10 MED ORDER — CHLORDIAZEPOXIDE HCL 25 MG PO CAPS: 25.0000 mg | ORAL_CAPSULE | Freq: Every day | ORAL | Status: DC

## 2014-06-10 MED ORDER — VITAMIN B-1 100 MG PO TABS
100.0000 mg | ORAL_TABLET | Freq: Every day | ORAL | Status: DC
  Administered 2014-06-11 – 2014-06-12 (×2): 100 mg via ORAL
  Filled 2014-06-10 (×4): qty 1

## 2014-06-10 MED ORDER — DEXTROSE-NACL 5-0.9 % IV SOLN: Freq: Once | INTRAVENOUS | Status: DC

## 2014-06-10 MED ORDER — ADULT MULTIVITAMIN W/MINERALS CH
1.0000 | ORAL_TABLET | Freq: Every day | ORAL | Status: DC
  Administered 2014-06-11 – 2014-06-12 (×2): 1 via ORAL
  Filled 2014-06-10 (×4): qty 1

## 2014-06-10 MED ORDER — ALUM & MAG HYDROXIDE-SIMETH 200-200-20 MG/5ML PO SUSP: 30.0000 mL | ORAL | Status: DC | PRN

## 2014-06-10 MED ORDER — ONDANSETRON 4 MG PO TBDP: 4.0000 mg | ORAL_TABLET | Freq: Four times a day (QID) | ORAL | Status: DC | PRN

## 2014-06-10 MED ORDER — CHLORDIAZEPOXIDE HCL 25 MG PO CAPS: 25.0000 mg | ORAL_CAPSULE | ORAL | Status: DC

## 2014-06-10 MED ORDER — POTASSIUM CHLORIDE CRYS ER 20 MEQ PO TBCR
20.0000 meq | EXTENDED_RELEASE_TABLET | Freq: Two times a day (BID) | ORAL | Status: AC
  Administered 2014-06-10 – 2014-06-12 (×4): 20 meq via ORAL
  Filled 2014-06-10 (×6): qty 1

## 2014-06-10 MED ORDER — CHLORDIAZEPOXIDE HCL 25 MG PO CAPS: 25.0000 mg | ORAL_CAPSULE | Freq: Four times a day (QID) | ORAL | Status: DC | PRN

## 2014-06-10 NOTE — ED Notes (Signed)
TTS at bedside. 

## 2014-06-10 NOTE — ED Notes (Signed)
Mother called and updated, to talk to mother

## 2014-06-10 NOTE — ED Notes (Signed)
Pt states that he wants detox from ETOH. States that he drinks a case of beer and half and ounce a marijuana a day. Pt was drinking ETOH prior to arrival.

## 2014-06-10 NOTE — BH Assessment (Signed)
Assessment Note  Devin Hudson is an 28 y.o. male with hx of alcohol use. [facility]. Patient started drinking at age 28. He has drank heavily every day since age 28. He drinks 5-6 40z beers daily. Additionally to the beer he drinks 1 pint of liquor each day. His last drink was today [facility]. The BAL was 401 @ 0753. Patient also reports use of THC. He [facility] use at age 28. He smokes 4-5 blunts daily and his last use was today [facility]. UDS is positive for THC. Withdrawal symptoms include: fatigue, hot/cold flashes, nausea, sweats, and irritability. No hx of seizures or DT's. He does have a hx of blackouts. Patient [place] [facility] was [facility] and he completed a 30 day program. Patient remained sober 1-2 months after completing the program before relapsing.   Patient denies SI, HI, and AVH's. No hx of self mutilating behaviors. No other associated hx reported. He does report depressive symptoms including loss of interest in usual pleasures, hopelessness, fatigue, and guilt.  Appetite is poor. Patient sts, "I haven't been to sleep in days... I just can't sleep". Patient is calm, cooperative, and polite. He has a family hx of alcoholism (dad and grandfather). Patient does not have any outpatient mental health providers at this time.      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 No significant medical problems   . Liver damage   . Alcohol abuse    Axis IV: other psychosocial or environmental problems, problems related to social environment, problems with access to health care services and problems with primary support group Axis V: 31-40 impairment in reality testing  Past Medical History:  Past Medical History  Diagnosis Date  . No significant medical problems   .  Liver damage   . Alcohol abuse     History reviewed. No pertinent past surgical history.  Family History: No family history on file.  Social History:  reports that he has been smoking Cigarettes.  He has been smoking about 0.50 packs per day. He has never used smokeless tobacco. He reports that he drinks alcohol. He reports that he uses illicit drugs (Marijuana).  Additional Social History:  Alcohol / Drug Use Pain Medications: SEE MAR Prescriptions: SEE MAR Over the Counter: SEE MAR History of alcohol / drug use?: Yes Longest period of sobriety (when/how long): 1-2 months  Negative Consequences of Use: Personal relationships Withdrawal Symptoms: Agitation;Nausea / Vomiting;Irritability;Fever / Chills;Sweats;Tingling;Tremors;Weakness Substance #1 Name of Substance 1: Alcohol  1 - Age of First Use: 28 yrs old  1 - Amount (size/oz): (5-6) 40 oz beers; 1 pint of liqour 1 - Frequency: daily  1 - Duration: on-goinig  1 - Last Use / Amount: "[facility]Marland Kitchen.Marland Kitchen.I drank a beer/liqour" Substance #2 Name of Substance 2: THC 2 - Age of First Use: 16 2 - Amount (size/oz): 4-5 blunts per day  2 - Frequency: daily  2 - Duration: on-going  2 - Last Use / Amount: "I smoked marrijuana before I came here to the Emergency Dept"  CIWA: CIWA-Ar BP: 125/80 mmHg Pulse Rate: 84 COWS:    Allergies:  Allergies  Allergen Reactions  . Peanut Butter Flavor Anaphylaxis and Hives  . Peanut-Containing Drug Products Anaphylaxis and Hives  .  Other     Bologna allergy, throat swelling at child.     Home Medications:  (Not in a hospital admission)  OB/GYN Status:  No LMP for male patient.  General Assessment Data [facility] of Assessment: [facility] Is this a Tele or Face-to-Face Assessment?: Face-to-Face Is this an Initial Assessment or a Re-assessment for this encounter?: Initial Assessment Living Arrangements: Other (Comment) ("I live everywhere I don't really have a permanent spot") Can  pt return to current living arrangement?: Yes Admission Status: Voluntary Is patient capable of signing voluntary admission?: Yes Transfer from: Acute Hospital Referral Source: Self/Family/Friend     [facility] Crisis Care Plan Living Arrangements: Other (Comment) ("I live everywhere I don't really have a permanent spot") Name of Psychiatrist:  (No psychiatrist ) Name of Therapist:  (No therapist )  Education Status Is patient currently in school?: No  Risk to self with the past 6 months Suicidal Ideation: No Suicidal Intent: No Is patient at risk for suicide?: No Suicidal Plan?: No Access to Means: No What has been your use of drugs/alcohol within the last 12 months?:  (n/a) Previous Attempts/Gestures: No How many times?:  (0) Other Self Harm Risks:  (n/a) Triggers for Past Attempts: Other (Comment) (no previous attempts or gestures ) Intentional Self Injurious Behavior: None Family Suicide History: No Recent stressful life event(s): Other (Comment) ("I don't really have any stressors.. just need to stop drink) Persecutory voices/beliefs?: No Depression: Yes Depression Symptoms: Feeling angry/irritable;Feeling worthless/self pity;Loss of interest in usual pleasures;Guilt;Fatigue;Isolating;Tearfulness;Insomnia;Despondent Substance abuse history and/or treatment for substance abuse?: No Suicide prevention information given to non-admitted patients: Not applicable  Risk to Others within the past 6 months Homicidal Ideation: No Thoughts of Harm to Others: No Current Homicidal Intent: No Current Homicidal Plan: No Access to Homicidal Means: No Identified Victim:  (n/a) History of harm to others?: No Assessment of Violence: None Noted Violent Behavior Description:  (patient is calm and cooperative ) Does patient have access to weapons?: No Criminal Charges Pending?: No Does patient have a court date: No  Psychosis Hallucinations: None noted Delusions: None noted  Mental  Status Report Appear/Hygiene: Disheveled Eye Contact: Good Motor Activity: Freedom of movement Speech: Logical/coherent Level of Consciousness: Alert Mood: Depressed Affect: Appropriate to circumstance Anxiety Level: None Thought Processes: Coherent Judgement: Unimpaired Orientation: Place;Time;Situation;Person Obsessive Compulsive Thoughts/Behaviors: None  Cognitive Functioning Concentration: Normal Memory: Recent Intact;Remote Intact IQ: Average Insight: Good Impulse Control: Poor Appetite: Poor Weight Loss:  (pt reports wt. loss but unable to provide the exact amt) Weight Gain:  (none reported ) Sleep: Decreased Total Hours of Sleep:  ("I can't sleep at all.Marland KitchenMarland KitchenI haven't been sleep in days") Vegetative Symptoms: Staying in bed  ADLScreening [facility] Assessment Services) Patient's cognitive ability adequate to safely complete daily activities?: Yes Patient able to express need for assistance with ADLs?: Yes Independently performs ADLs?: Yes (appropriate for developmental age)  Prior Inpatient Therapy Prior Inpatient Therapy: Yes Prior Therapy Dates:  (2 yrs ago) Prior Therapy Facilty/Provider(s):  ("[facility] [place] (1 montht)) Reason for Treatment:  (substance abuse rehab program)  Prior Outpatient Therapy Prior Outpatient Therapy: No Prior Therapy Dates:  (n/a) Prior Therapy Facilty/Provider(s):  (n/a) Reason for Treatment:  (n/a)  ADL Screening (condition at time of admission) Patient's cognitive ability adequate to safely complete daily activities?: Yes Is the patient deaf or have difficulty hearing?: No Does the patient have difficulty seeing, even when wearing glasses/contacts?: No Does the patient have difficulty concentrating, remembering, or making decisions?: No Patient  able to express need for assistance with ADLs?: Yes Does the patient have difficulty dressing or bathing?: No Independently performs ADLs?: Yes (appropriate for  developmental age) Does the patient have difficulty walking or climbing stairs?: No Weakness of Legs: None Weakness of Arms/Hands: None  Home Assistive Devices/Equipment Home Assistive Devices/Equipment: None    Abuse/Neglect Assessment (Assessment to be complete while patient is alone) Physical Abuse: Denies Verbal Abuse: Denies Sexual Abuse: Denies Exploitation of patient/patient's resources: Denies Self-Neglect: Denies Values / Beliefs Cultural Requests During Hospitalization: None Spiritual Requests During Hospitalization: None   Advance Directives (For Healthcare) Advance Directive: Patient does not have advance directive Nutrition Screen- [facility] Adult/[facility] Patient's home diet: Regular  Additional Information 1:1 In Past 12 Months?: No CIRT Risk: No Elopement Risk: No Does patient have medical clearance?: Yes     Disposition:  Disposition Initial Assessment Completed for this Encounter: Yes Disposition of Patient: Inpatient treatment program;Referred to ([facility] and [facility]) Type of inpatient treatment program: Adult Patient referred to: [facility]  On Site Evaluation by:   Reviewed with Physician:    Melynda Ripple [facility] 06/10/2014 11:02 AM

## 2014-06-10 NOTE — ED Notes (Signed)
Meal tray was given to pt. Nurses was notified.

## 2014-06-10 NOTE — Progress Notes (Signed)
Vol admit, 28 yo AA male, admitted for alcohol detox.  Pt reports he has been drinking since the age of 28 with a period of sobriety of 2 months [place].  Pt reports he drinks 5-6 40's and a pint of liquor daily and also smokes 4-5 blunts daily.  Pt says he needs to stop drinking because it is affecting his liver and he has small children he needs to care for.  He lives with his mother and the mother of his children who are supportive.  Pt denies any major medical issues.  Pt was polite/cooperative with the admission process.  Search was done and paperwork signed.  Pt was oriented to unit/room.  [facility] protocol initiated.  Safety checks q15 minutes initiated.

## 2014-06-10 NOTE — ED Provider Notes (Signed)
CSN: [id number]     Arrival date & time 06/10/14  4098 History   First MD Initiated Contact with Patient 06/10/14 0757     Chief Complaint  Patient presents with  . Alcohol Problem     (Consider location/radiation/quality/duration/timing/severity/associated sxs/prior Treatment) HPI Devin Hudson is a 28 year old male with no significant past medical history here for alcohol detox. Patient states he drinks a couple 40 ounce beers per day as well as a fifth of alcohol per day. He also admits to smoking marijuana immediately prior to arrival and having alcohol prior to arrival. He states he's been doing this for years and is now seeking detox for the first time in [place], he has gone to detox program in [place] in the past. He denies suicidal or homicidal ideations. He states when he does not drink he does get the shakes but he has never had a seizure in the past. Patient denies abuse of other illicit drugs. Patient states that he feels that he is withdrawing currently. He denies any fevers or recent infections.  He states he has had nausea vomiting and diarrhea but that is normal for him every day with heavy drinking.  Past Medical History  Diagnosis Date  . No significant medical problems   . Liver damage   . Alcohol abuse    History reviewed. No pertinent past surgical history. No family history on file. History  Substance Use Topics  . Smoking status: Current Every Day Smoker -- 0.50 packs/day    Types: Cigarettes  . Smokeless tobacco: Never Used  . Alcohol Use: Yes     Comment: heavy    Review of Systems 10 Systems reviewed and are negative for acute change except as noted in the HPI.    Allergies  Peanut butter flavor; Peanut-containing drug products; and Other  Home Medications   Prior to Admission medications   Not on File   BP 125/80  Pulse 84  Temp(Src) 98.7 F (37.1 C) (Oral)  Resp 18  SpO2 99% Physical Exam  Nursing note and vitals  reviewed. Constitutional: He is oriented to person, place, and time. Vital signs are normal. He appears well-developed and well-nourished.  Non-toxic appearance. He does not appear ill. No distress.  HENT:  Head: Normocephalic and atraumatic.  Nose: Nose normal.  Mouth/Throat: Oropharynx is clear and moist. No oropharyngeal exudate.  No tongue fasciculations seen.  Eyes: Conjunctivae and EOM are normal. Pupils are equal, round, and reactive to light. No scleral icterus.  Neck: Normal range of motion. Neck supple. No tracheal deviation, no edema, no erythema and normal range of motion present. No mass and no thyromegaly present.  Cardiovascular: Normal rate, regular rhythm, S1 normal, S2 normal, normal heart sounds, intact distal pulses and normal pulses.  Exam reveals no gallop and no friction rub.   No murmur heard. Pulses:      Radial pulses are 2+ on the right side, and 2+ on the left side.       Dorsalis pedis pulses are 2+ on the right side, and 2+ on the left side.  No tachycardia.  Pulmonary/Chest: Effort normal and breath sounds normal. No respiratory distress. He has no wheezes. He has no rhonchi. He has no rales.  Abdominal: Soft. Normal appearance and bowel sounds are normal. He exhibits no distension, no ascites and no mass. There is no hepatosplenomegaly. There is no tenderness. There is no rebound, no guarding and no CVA tenderness.  Musculoskeletal: Normal range of motion. He exhibits  no edema and no tenderness.  Lymphadenopathy:    He has no cervical adenopathy.  Neurological: He is alert and oriented to person, place, and time. He has normal strength. No sensory deficit. Coordination normal. GCS eye subscore is 4. GCS verbal subscore is 5. GCS motor subscore is 6.  Steady gait, able to ambulate without assistance  Skin: Skin is warm, dry and intact. No petechiae and no rash noted. He is not diaphoretic. No erythema. No pallor.  Psychiatric: He has a normal mood and affect. His  behavior is normal. Judgment normal.    ED Course  Procedures (including critical care time) Labs Review Labs Reviewed  COMPREHENSIVE METABOLIC PANEL - Abnormal; Notable for the following:    Potassium 3.6 (*)    Glucose, Bld 106 (*)    Total Protein 8.5 (*)    AST 57 (*)    Anion gap 18 (*)    All other components within normal limits  ETHANOL - Abnormal; Notable for the following:    Alcohol, Ethyl (B) 401 (*)    All other components within normal limits  SALICYLATE LEVEL - Abnormal; Notable for the following:    Salicylate Lvl <2.0 (*)    All other components within normal limits  URINE RAPID DRUG SCREEN (HOSP PERFORMED) - Abnormal; Notable for the following:    Tetrahydrocannabinol POSITIVE (*)    All other components within normal limits  ACETAMINOPHEN LEVEL  CBC    Imaging Review No results found.   EKG Interpretation None      MDM   Final diagnoses:  None    Patient is here requesting detox from alcohol. Alcohol level today is 405 however the patient is currently clinically sober and able to ambulate throughout the hallways without any assistance.  Upon repeat assessment the patient continues to state he feels as if he is withdrawing. There continues to not be any tachycardia on the monitor nor does he have any tongue fasciculations. Patient was given Librium for symptomatic treatment. Repeat ethanol level was 305. Patient has a bed at detox center and is awaiting his ethanol level to drop below 200. The patient will be signed out to oncoming physician for ultimate disposition.  Repeat ethanol level be done at 7 PM.    Devin CrumbleAdeleke Kerrilynn Derenzo, MD 06/10/14 [phone]

## 2014-06-10 NOTE — BH Assessment (Addendum)
[facility] Ladona Rid[facility]aylor. The attending physician is Dr. Jama Flavorsobos. The room assignment is 508-1. Support paperwork completed. Patient's BAL must be below 200 [facility] Nursing report # is [phone].

## 2014-06-10 NOTE — Consult Note (Addendum)
  Mr Devin Hudson has [facility] 500 [facility] as soon as his blood alcohol gets below 200.  The bed has been assigned as 508-1

## 2014-06-10 NOTE — ED Notes (Signed)
[facility] was notified of pt's transfer to [facility]. 

## 2014-06-10 NOTE — Tx Team (Signed)
Initial Interdisciplinary Treatment Plan   PATIENT STRESSORS: Financial difficulties Substance abuse   PROBLEM LIST: Problem List/Patient Goals Date to be addressed Date deferred Reason deferred Estimated date of resolution  Alcohol abuse      Hasn't been able to sleep in several days      Depression                                           DISCHARGE CRITERIA:  Adequate post-discharge living arrangements Improved stabilization in mood, thinking, and/or behavior Motivation to continue treatment in a less acute level of care Verbal commitment to aftercare and medication compliance Withdrawal symptoms are absent or subacute and managed without 24-hour nursing intervention  PRELIMINARY DISCHARGE PLAN: Attend 12-step recovery group Outpatient therapy Return to previous living arrangement  PATIENT/FAMIILY INVOLVEMENT: This treatment plan has been presented to and reviewed with the patient, Devin Hudson, and/or family member.  The patient and family have been given the opportunity to ask questions and make suggestions.  Devin GeneraSpeagle, [facility]in Hudson [facility] 06/10/2014, 10:59 PM

## 2014-06-10 NOTE — Progress Notes (Signed)
[facility] Community Liaison Stacy,  ° °Provided pt with a [facility] Card application, highlighting [facility], to help patient establish primary care.  °

## 2014-06-10 NOTE — ED Notes (Signed)
[facility] here to transport pt to [facility] 

## 2014-06-11 ENCOUNTER — Encounter (HOSPITAL_COMMUNITY): Payer: Self-pay | Admitting: Psychiatry

## 2014-06-11 DIAGNOSIS — F4310 Post-traumatic stress disorder, unspecified: Secondary | ICD-10-CM | POA: Diagnosis present

## 2014-06-11 DIAGNOSIS — F1020 Alcohol dependence, uncomplicated: Secondary | ICD-10-CM | POA: Diagnosis present

## 2014-06-11 DIAGNOSIS — F1994 Other psychoactive substance use, unspecified with psychoactive substance-induced mood disorder: Secondary | ICD-10-CM | POA: Diagnosis present

## 2014-06-11 LAB — HIV ANTIBODY (ROUTINE TESTING W REFLEX): HIV 1&2 Ab, 4th Generation: NONREACTIVE

## 2014-06-11 LAB — LIPID PANEL
Cholesterol: 208 mg/dL — ABNORMAL HIGH (ref 0–200)
HDL: 163 mg/dL (ref >39)
LDL Cholesterol: 37 mg/dL (ref 0–99)
TRIGLYCERIDES: 40 mg/dL (ref <150)
Total CHOL/HDL Ratio: 1.3 RATIO
VLDL: 8 mg/dL (ref 0–40)

## 2014-06-11 LAB — TSH: TSH: 1.57 u[IU]/mL (ref 0.350–4.500)

## 2014-06-11 LAB — URINALYSIS, ROUTINE W REFLEX MICROSCOPIC
Bilirubin Urine: NEGATIVE
GLUCOSE, UA: NEGATIVE mg/dL
HGB URINE DIPSTICK: NEGATIVE
Ketones, ur: NEGATIVE mg/dL
Leukocytes, UA: NEGATIVE
Nitrite: NEGATIVE
Protein, ur: NEGATIVE mg/dL
SPECIFIC GRAVITY, URINE: 1.028 (ref 1.005–1.030)
UROBILINOGEN UA: 1 mg/dL (ref 0.0–1.0)
pH: 5.5 (ref 5.0–8.0)

## 2014-06-11 LAB — HEMOGLOBIN A1C
HEMOGLOBIN A1C: 5.5 % (ref <5.7)
Mean Plasma Glucose: 111 mg/dL (ref <117)

## 2014-06-11 MED ORDER — LORAZEPAM 1 MG PO TABS: 1.0000 mg | ORAL_TABLET | Freq: Every day | ORAL | Status: DC

## 2014-06-11 MED ORDER — ENSURE COMPLETE PO LIQD
237.0000 mL | Freq: Two times a day (BID) | ORAL | Status: DC
  Administered 2014-06-11: 237 mL via ORAL

## 2014-06-11 MED ORDER — NICOTINE 14 MG/24HR TD PT24
14.0000 mg | MEDICATED_PATCH | Freq: Every day | TRANSDERMAL | Status: DC
  Filled 2014-06-11 (×4): qty 1

## 2014-06-11 MED ORDER — LORAZEPAM 1 MG PO TABS
1.0000 mg | ORAL_TABLET | Freq: Three times a day (TID) | ORAL | Status: DC
  Administered 2014-06-12: 1 mg via ORAL
  Filled 2014-06-11: qty 1

## 2014-06-11 MED ORDER — LORAZEPAM 1 MG PO TABS: 1.0000 mg | ORAL_TABLET | Freq: Two times a day (BID) | ORAL | Status: DC

## 2014-06-11 MED ORDER — LORAZEPAM 1 MG PO TABS: 1.0000 mg | ORAL_TABLET | Freq: Four times a day (QID) | ORAL | Status: DC | PRN

## 2014-06-11 MED ORDER — LORAZEPAM 1 MG PO TABS
1.0000 mg | ORAL_TABLET | Freq: Four times a day (QID) | ORAL | Status: AC
  Administered 2014-06-11 (×4): 1 mg via ORAL
  Filled 2014-06-11 (×4): qty 1

## 2014-06-11 NOTE — [facility] Group Notes (Signed)
0900 nursing orientation group  The focus of this group is to educate the patient on the purpose and policies of crisis stabilization and provide a format to answer questions about their admission.  The group details unit policies and expectations of patients while admitted.   Pt did not attend. 

## 2014-06-11 NOTE — [facility] Suicide Risk Assessment (Signed)
Suicide Risk Assessment  Admission Assessment     Nursing information obtained from:  Patient;Review of record Demographic factors:  Male;Adolescent or young adult;Low socioeconomic status;Unemployed Current Mental Status:  NA Loss Factors:  NA Historical Factors:  Family history of mental illness or substance abuse Risk Reduction Factors:  Responsible for children under 26 years of age;Sense of responsibility to family;Living with another person, especially a relative;Positive social support Total Time spent with patient: 45 minutes  CLINICAL FACTORS:   Alcohol/Substance Abuse/Dependencies  COGNITIVE FEATURES THAT CONTRIBUTE TO RISK:  Closed-mindedness Polarized thinking Thought constriction (tunnel vision)    SUICIDE RISK:   Mild:  Suicidal ideation of limited frequency, intensity, duration, and specificity.  There are no identifiable plans, no associated intent, mild dysphoria and related symptoms, good self-control (both objective and subjective assessment), few other risk factors, and identifiable protective factors, including available and accessible social support.  PLAN OF CARE: Supportive approach/coping skills/relapse prevention                               Ativan Detox Protocol                              Reassess and address the co morbidities  I certify that inpatient services furnished can reasonably be expected to improve the patient's condition.  Sharalyn Lomba A 06/11/2014, 4:07 PM

## 2014-06-11 NOTE — Progress Notes (Signed)
Adult Psychoeducational Group Note  Date:  06/11/2014 Time:  10:34 PM  Group Topic/Focus:  Wrap-Up Group:   The focus of this group is to help patients review their daily goal of treatment and discuss progress on daily workbooks.  Participation Level:  Minimal  Participation Quality:  Appropriate  Affect:  Appropriate  Cognitive:  Alert and Oriented  Insight: Appropriate  Engagement in Group:  Developing/Improving  Modes of Intervention:  Exploration and Support  Additional Comments:  Patient verbalized that he had an all right day. Patient stated that one positive was that he was able to talk to his kids. Patient stated that two positive coping skills that he can use are sleep and play with his children.  Barnet Benavides, Randal Bubaerri Lee 06/11/2014, 10:34 PM

## 2014-06-11 NOTE — [facility] Suicide Risk Assessment (Signed)
[facility] INPATIENT: Family/Significant Other Suicide Prevention Education  Suicide Prevention Education:  Education Completed; No one has been identified by the patient as the family member/significant other with whom the patient will be residing, and identified as the person(s) who will aid the patient in the event of a mental health crisis (suicidal ideations/suicide attempt).   Pt did not c/o SI at admission, nor have they endorsed SI during their stay here. SPE not required. SPI pamphlet provided to pt and he was encouraged to share information with support network, ask questions, and talk about any concerns relating to SPE.  The Sherwin-WilliamsHeather Smart, LCSWA  06/11/2014 2:53 PM

## 2014-06-11 NOTE — Progress Notes (Signed)
D   Pt is depressed and sad   He reports some mild withdrawal symptoms but reports medication is helping   He is compliant with treatment and interacts appropriately with others A   Verbal support given   Educate on withdrawal symptoms and detox medications and therapeutic use   Medications administered and effectiveness monitored    Q 15 min checks R   Pt is safe and receptive

## 2014-06-11 NOTE — [facility] Group Notes (Signed)
[facility] LCSW Group Therapy  06/11/2014 3:13 PM  Type of Therapy:  Group Therapy  Participation Level:  Minimal  Participation Quality:  Drowsy  Affect:  Lethargic  Cognitive:  Lacking  Insight:  Limited  Engagement in Therapy:  Limited  Modes of Intervention:  Discussion, Education, Exploration, Rapport Building, Socialization and Support  Summary of Progress/Problems: MHA Speaker came to talk about his personal journey with substance abuse and addiction. Devin Hudson came to group approximately 10 minutes late. He was drowsy and had difficulty remaining attentive during group. At this time, Devin Hudson appears to be making limited progress in the group setting. MHA speaker provided handouts and educational information pertaining to groups and services offered by the [facility].    Hudson, Devin Pauwels LCSWA  06/11/2014, 3:13 PM
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
## 2014-06-11 NOTE — Progress Notes (Signed)
Pt has been up for most groups today.  He rated his depression 2 anxiety a 3 and denied any hopelessness on his self-inventory.  He denied any S/H ideation or A/V/H. His goal today "work on myself by going to groups to work on my coping skills"  He is thinking about going to [facility].
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
## 2014-06-11 NOTE — Progress Notes (Signed)
NUTRITION ASSESSMENT  Pt identified as at risk on the Malnutrition Screen Tool  INTERVENTION: 1. Educated patient on the importance of nutrition and encouraged intake of food and beverages. 2. Discussed weight goals. 3. Supplements: Ensure Complete BID  NUTRITION DIAGNOSIS: Unintentional weight loss related to sub-optimal intake as evidenced by pt report.   Goal: Pt to meet >/= 90% of their estimated nutrition needs.  Monitor:  PO intake  Assessment:  Patient admitted for alcohol detox. He reports that his intake is variable. He eats fairly well when drinking, but will sometimes go 1-2 days without eating likely secondary to alcohol withdrawal symptoms. Currently appetite is fair, but he is trying to eat some of his meals. He has lost about 6-11 pounds over an unknown amount of time (4-7% weight loss).  28 y.o. male  Height: Ht Readings from Last 1 Encounters:  06/10/14 5' 10.5" (1.791 m)    Weight: Wt Readings from Last 1 Encounters:  06/10/14 139 lb (63.05 kg)    Weight Hx: Wt Readings from Last 10 Encounters:  06/10/14 139 lb (63.05 kg)    BMI:  Body mass index is 19.66 kg/(m^2). Pt meets criteria for normal weight based on current BMI.  Estimated Nutritional Needs: Kcal: 25-30 kcal/kg Protein: > 1 gram protein/kg Fluid: 1 ml/kcal  Diet Order: General Pt is also offered choice of unit snacks mid-morning and mid-afternoon.  Pt is eating as desired.   Lab results and medications reviewed.   Linnell FullingElyse Aesha Agrawal, RD, LDN Pager #: [phone] After-Hours Pager #: [phone]

## 2014-06-12 DIAGNOSIS — F39 Unspecified mood [affective] disorder: Secondary | ICD-10-CM

## 2014-06-12 DIAGNOSIS — F1020 Alcohol dependence, uncomplicated: Principal | ICD-10-CM

## 2014-06-12 DIAGNOSIS — F10239 Alcohol dependence with withdrawal, unspecified: Secondary | ICD-10-CM

## 2014-06-12 DIAGNOSIS — F4310 Post-traumatic stress disorder, unspecified: Secondary | ICD-10-CM

## 2014-06-12 DIAGNOSIS — F10939 Alcohol use, unspecified with withdrawal, unspecified: Secondary | ICD-10-CM

## 2014-06-12 LAB — GC/CHLAMYDIA PROBE AMP
CT Probe RNA: NEGATIVE
GC Probe RNA: NEGATIVE

## 2014-06-12 MED ORDER — TRAZODONE HCL 50 MG PO TABS
50.0000 mg | ORAL_TABLET | Freq: Every evening | ORAL | Status: DC | PRN
Start: 2014-06-12 — End: 2024-01-07

## 2014-06-12 NOTE — Progress Notes (Signed)
[facility] Discharge Plan :  Will you be returning to the same living situation after discharge: Yes,  home with his mother At discharge, do you have transportation home?:Yes,  friend Do you have the ability to pay for your medications:Yes,  [employer]  Release of information consent forms completed and submitted to medical records by CSW.  Patient to Follow up at: Follow-up Information   Follow up with [facility] On 06/17/2014. (Appt. with Mr. [facility] at 1:00PM for assessment. (med management/therapy/SAIOP))    Contact information:   [address] [place], [place] [address] Phone: [phone] Fax: [phone]      Patient denies SI/HI:   Yes,  during admission, group, and self report.     Safety Planning and Suicide Prevention discussed:  Yes,  SPE not required for this pt. SPI pamphlet provided to pt and he was encouraged to share information with support network, ask questions, and talk about any concerns relating to SPE.  Smart, Tyreisha Ungar LCSWA  06/12/2014, 9:31 AM

## 2014-06-12 NOTE — [facility] Group Notes (Signed)
[facility] L[facility] Dis[facility] Note   06/12/2014  9:30 AM   Participation Quality: Alert, Appropriate and Oriented  Mood/Affect:Appropriate  Depression Rating: 0  Anxiety Rating: 0  Thoughts of Suicide: Pt denies SI/HI  Will you contract for safety? Yes  Current AVH: Pt denies  Plan for Discharge/Comments: Pt attended discharge planning group and actively participated in group. CSW provided pt with today's workbook. Patient reports that he feels "good" today and wishes to discharge to be with his mother who is having surgery today. He can contract for safety at this time. Patient agreeable to follow up with [facility] on 06/17/14.  Transportation Means: Pt reports access to transportation  Supports: No supports mentioned at this time  Samuella BruinKristin Martavius Lusty, MSW, [employer]CSWA Clinical Social Worker [facility] [phone]

## 2014-06-12 NOTE — [facility] Suicide Risk Assessment (Signed)
Suicide Risk Assessment  Discharge Assessment     Demographic Factors:  Male  Total Time spent with patient: 30 minutes  Psychiatric Specialty Exam:     Blood pressure 123/84, pulse 91, temperature 97.7 F (36.5 C), temperature source Oral, resp. rate 20, height 5' 10.5" (1.791 m), weight 63.05 kg (139 lb), SpO2 98.00%.Body mass index is 19.66 kg/(m^2).  General Appearance: Fairly Groomed  Patent attorneyye Contact::  Fair  Speech:  Clear and Coherent  Volume:  Normal  Mood:  Euthymic  Affect:  Appropriate  Thought Process:  Coherent and Goal Directed  Orientation:  Full (Time, Place, and Person)  Thought Content:  plans as he moves on, relapse prevention plan  Suicidal Thoughts:  No  Homicidal Thoughts:  No  Memory:  Immediate;   Fair Recent;   Fair Remote;   Fair  Judgement:  Intact  Insight:  Present  Psychomotor Activity:  Normal  Concentration:  Fair  Recall:  Fis[employer]Fair  Fund of Knowledge:NA  Language: Fair  Akathisia:  No  Handed:  Right  AIMS (if indicated):     Assets:  Desire for Improvement Housing Social Support  Sleep:  Number of Hours: 6.75    Musculoskeletal: Strength & Muscle Tone: within normal limits Gait & Station: normal Patient leans: N/A   Mental Status Per Nursing Assessment::   On Admission:  NA  Current Mental Status by Physician: In full contact with reality. There are no active S/S of withdrawal. His mood is euthymic his affect is appropriate. He is willing and motivated to pursue outpatient treatment   Loss Factors: Loss of significant relationship  Historical Factors: NA  Risk Reduction Factors:   Responsible for children under 26 years of age, Sense of responsibility to family, Living with another person, especially a relative and Positive social support  Continued Clinical Symptoms:  Alcohol/Substance Abuse/Dependencies  Cognitive Features That Contribute To Risk:  Closed-mindedness Polarized thinking Thought constriction (tunnel  vision)    Suicide Risk:  Minimal: No identifiable suicidal ideation.  Patients presenting with no risk factors but with morbid ruminations; may be classified as minimal risk based on the severity of the depressive symptoms  Discharge Diagnoses:   AXIS I:  Alcohol dependence, S/P alcohol withdrawal, PTSD, Substance induced mood disorder AXIS II:  No diagnosis AXIS III:   Past Medical History  Diagnosis Date  . No significant medical problems   . Liver damage   . Alcohol abuse    AXIS IV:  other psychosocial or environmental problems AXIS V:  61-70 mild symptoms  Plan Of Care/Follow-up recommendations:  Activity:  as tolerated Diet:  regular Follow up outpatient basis/[facility] Is patient on multiple antipsychotic therapies at discharge:  No   Has Patient had three or more failed trials of antipsychotic monotherapy by history:  No  Recommended Plan for Multiple Antipsychotic Therapies: NA    Jalyne Brodzinski A 06/12/2014, 10:13 AM
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
## 2014-06-12 NOTE — Progress Notes (Signed)
D) Pt being discharged. Denies SI and HI. Delusions and hallucinations. Pt rates his depression and hopelessness and anxiety as 0. Affect and mood are appropriate. A) Pt given support and reassurance along with praise. All medications explained to Pt. All follow up plans explained to Pt. Pt provided with samples from the pharmacy. All belongings returned to Pt. R) Denies SI and HI. States he is going to follow up with his outpatient plans.

## 2014-06-17 NOTE — Progress Notes (Signed)
Patient Discharge Instructions:  After Visit Summary (AVS):   Faxed to:  06/17/14 Discharge Summary Note:   Faxed to:  06/17/14 Psychiatric Admission Assessment Note:   Faxed to:  06/17/14 Suicide Risk Assessment - Discharge Assessment:   Faxed to:  06/17/14 Faxed/Sent to the Next Level Care provider:  06/17/14 Faxed to [facility] @ [phone]  Jerelene ReddenSheena E [place], 06/17/2014, 3:48 PM

## 2014-09-29 ENCOUNTER — Encounter (HOSPITAL_COMMUNITY): Payer: Self-pay | Admitting: Emergency Medicine

## 2014-09-29 ENCOUNTER — Emergency Department (HOSPITAL_COMMUNITY)
Admission: EM | Admit: 2014-09-29 | Discharge: 2014-09-30 | Disposition: A | Discharge status: Home / Self Care | Payer: PRIVATE HEALTH INSURANCE | Attending: Emergency Medicine | Admitting: Emergency Medicine

## 2014-09-29 DIAGNOSIS — R1013 Epigastric pain: Secondary | ICD-10-CM

## 2014-09-29 DIAGNOSIS — Z720 Tobacco use: Secondary | ICD-10-CM | POA: Insufficient documentation

## 2014-09-29 DIAGNOSIS — F1010 Alcohol abuse, uncomplicated: Secondary | ICD-10-CM | POA: Insufficient documentation

## 2014-09-29 DIAGNOSIS — K920 Hematemesis: Secondary | ICD-10-CM | POA: Insufficient documentation

## 2014-09-29 DIAGNOSIS — K2920 Alcoholic gastritis without bleeding: Secondary | ICD-10-CM | POA: Insufficient documentation

## 2014-09-29 NOTE — ED Notes (Signed)
Pt states he started vomiting blood 20 minutes ago. Pt has hx of alcoholism and states he stopped drinking for 5 days and started again. Pt has had 3 beers and a bottle of vodka today. Pt alert ,oriented and ambulatory.

## 2014-09-29 NOTE — ED Notes (Signed)
[facility] Expected date:  Expected time:  Means of arrival:  Comments: EMS, [age] M, Hematemesis/Hx of ETOH

## 2014-09-30 LAB — CBC WITH DIFFERENTIAL/PLATELET
BASOS ABS: 0.1 10*3/uL (ref 0.0–0.1)
BASOS PCT: 1 % (ref 0–1)
EOS ABS: 0.1 10*3/uL (ref 0.0–0.7)
EOS PCT: 1 % (ref 0–5)
HCT: 43.6 % (ref 39.0–52.0)
Hemoglobin: 15.4 g/dL (ref 13.0–17.0)
Lymphocytes Relative: 28 % (ref 12–46)
Lymphs Abs: 2.3 10*3/uL (ref 0.7–4.0)
MCH: 33 pg (ref 26.0–34.0)
MCHC: 35.3 g/dL (ref 30.0–36.0)
MCV: 93.4 fL (ref 78.0–100.0)
Monocytes Absolute: 0.6 10*3/uL (ref 0.1–1.0)
Monocytes Relative: 7 % (ref 3–12)
Neutro Abs: 5.3 10*3/uL (ref 1.7–7.7)
Neutrophils Relative %: 63 % (ref 43–77)
PLATELETS: 357 10*3/uL (ref 150–400)
RBC: 4.67 MIL/uL (ref 4.22–5.81)
RDW: 14.1 % (ref 11.5–15.5)
WBC: 8.3 10*3/uL (ref 4.0–10.5)

## 2014-09-30 LAB — COMPREHENSIVE METABOLIC PANEL
ALBUMIN: 4.5 g/dL (ref 3.5–5.2)
ALT: 90 U/L — AB (ref 0–53)
AST: 105 U/L — AB (ref 0–37)
Alkaline Phosphatase: 85 U/L (ref 39–117)
Anion gap: 21 — ABNORMAL HIGH (ref 5–15)
BUN: 11 mg/dL (ref 6–23)
CALCIUM: 9.1 mg/dL (ref 8.4–10.5)
CO2: 21 mEq/L (ref 19–32)
Chloride: 97 mEq/L (ref 96–112)
Creatinine, Ser: 0.87 mg/dL (ref 0.50–1.35)
GFR calc Af Amer: >90 mL/min (ref >90)
GFR calc non Af Amer: >90 mL/min (ref >90)
Glucose, Bld: 84 mg/dL (ref 70–99)
Potassium: 4.1 mEq/L (ref 3.7–5.3)
SODIUM: 139 meq/L (ref 137–147)
Total Bilirubin: 0.9 mg/dL (ref 0.3–1.2)
Total Protein: 8.7 g/dL — ABNORMAL HIGH (ref 6.0–8.3)

## 2014-09-30 MED ORDER — GI COCKTAIL ~~LOC~~
30.0000 mL | Freq: Once | ORAL | Status: AC
  Administered 2014-09-30: 30 mL via ORAL
  Filled 2014-09-30: qty 30

## 2014-09-30 MED ORDER — SUCRALFATE 1 GM/10ML PO SUSP: 1.0000 g | Freq: Three times a day (TID) | ORAL | Status: DC

## 2014-09-30 MED ORDER — FAMOTIDINE 20 MG PO TABS: 20.0000 mg | ORAL_TABLET | Freq: Two times a day (BID) | ORAL | Status: DC

## 2014-09-30 MED ORDER — LORAZEPAM 2 MG/ML IJ SOLN
0.5000 mg | Freq: Once | INTRAMUSCULAR | Status: AC
  Administered 2014-09-30: 0.5 mg via INTRAVENOUS
  Filled 2014-09-30: qty 1

## 2014-09-30 MED ORDER — ONDANSETRON HCL 4 MG/2ML IJ SOLN
4.0000 mg | Freq: Once | INTRAMUSCULAR | Status: AC
  Administered 2014-09-30: 4 mg via INTRAVENOUS
  Filled 2014-09-30: qty 2

## 2014-09-30 MED ORDER — LORAZEPAM 1 MG PO TABS
1.0000 mg | ORAL_TABLET | Freq: Three times a day (TID) | ORAL | Status: DC | PRN
Start: 2014-09-30 — End: 2024-01-07

## 2014-09-30 MED ORDER — SODIUM CHLORIDE 0.9 % IV BOLUS (SEPSIS)
1000.0000 mL | Freq: Once | INTRAVENOUS | Status: AC
  Administered 2014-09-30: 1000 mL via INTRAVENOUS

## 2014-09-30 MED ORDER — PANTOPRAZOLE SODIUM 40 MG IV SOLR
40.0000 mg | Freq: Once | INTRAVENOUS | Status: AC
  Administered 2014-09-30: 40 mg via INTRAVENOUS
  Filled 2014-09-30: qty 40

## 2014-09-30 NOTE — Discharge Instructions (Signed)
Gastritis, Adult Gastritis is soreness and swelling (inflammation) of the lining of the stomach. Gastritis can develop as a sudden onset (acute) or long-term (chronic) condition. If gastritis is not treated, it can lead to stomach bleeding and ulcers. CAUSES  Gastritis occurs when the stomach lining is weak or damaged. Digestive juices from the stomach then inflame the weakened stomach lining. The stomach lining may be weak or damaged due to viral or bacterial infections. One common bacterial infection is the Helicobacter pylori infection. Gastritis can also result from excessive alcohol consumption, taking certain medicines, or having too much acid in the stomach.  SYMPTOMS  In some cases, there are no symptoms. When symptoms are present, they may include:  Pain or a burning sensation in the upper abdomen.  Nausea.  Vomiting.  An uncomfortable feeling of fullness after eating. DIAGNOSIS  Your caregiver may suspect you have gastritis based on your symptoms and a physical exam. To determine the cause of your gastritis, your caregiver may perform the following:  Blood or stool tests to check for the H pylori bacterium.  Gastroscopy. A thin, flexible tube (endoscope) is passed down the esophagus and into the stomach. The endoscope has a light and camera on the end. Your caregiver uses the endoscope to view the inside of the stomach.  Taking a tissue sample (biopsy) from the stomach to examine under a microscope. TREATMENT  Depending on the cause of your gastritis, medicines may be prescribed. If you have a bacterial infection, such as an H pylori infection, antibiotics may be given. If your gastritis is caused by too much acid in the stomach, H2 blockers or antacids may be given. Your caregiver may recommend that you stop taking aspirin, ibuprofen, or other nonsteroidal anti-inflammatory drugs (NSAIDs). HOME CARE INSTRUCTIONS  Only take over-the-counter or prescription medicines as directed by  your caregiver.  If you were given antibiotic medicines, take them as directed. Finish them even if you start to feel better.  Drink enough fluids to keep your urine clear or pale yellow.  Avoid foods and drinks that make your symptoms worse, such as:  Caffeine or alcoholic drinks.  Chocolate.  Peppermint or mint flavorings.  Garlic and onions.  Spicy foods.  Citrus fruits, such as oranges, lemons, or limes.  Tomato-based foods such as sauce, chili, salsa, and pizza.  Fried and fatty foods.  Eat small, frequent meals instead of large meals. SEEK IMMEDIATE MEDICAL CARE IF:   You have black or dark red stools.  You vomit blood or material that looks like coffee grounds.  You are unable to keep fluids down.  Your abdominal pain gets worse.  You have a fever.  You do not feel better after 1 week.  You have any other questions or concerns. MAKE SURE YOU:  Understand these instructions.  Will watch your condition.  Will get help right away if you are not doing well or get worse. Document Released: 10/10/2001 Document Revised: 04/16/2012 Document Reviewed: 11/29/2011 [facility] Patient Information 2015 [place], [place]. This information is not intended to replace advice given to you by your health care provider. Make sure you discuss any questions you have with your health care provider. Peptic Ulcer A peptic ulcer is a sore in the lining of your esophagus (esophageal ulcer), stomach (gastric ulcer), or in the first part of your small intestine (duodenal ulcer). The ulcer causes erosion into the deeper tissue. CAUSES  Normally, the lining of the stomach and the small intestine protects itself from the acid  that digests food. The protective lining can be damaged by:  An infection caused by a bacterium called Helicobacter pylori (H. pylori).  Regular use of nonsteroidal anti-inflammatory drugs (NSAIDs), such as ibuprofen or aspirin.  Smoking tobacco. Other risk factors  include being older than 50, drinking alcohol excessively, and having a family history of ulcer disease.  SYMPTOMS   Burning pain or gnawing in the area between the chest and the belly button.  Heartburn.  Nausea and vomiting.  Bloating. The pain can be worse on an empty stomach and at night. If the ulcer results in bleeding, it can cause:  Black, tarry stools.  Vomiting of bright red blood.  Vomiting of coffee-ground-looking materials. DIAGNOSIS  A diagnosis is usually made based upon your history and an exam. Other tests and procedures may be performed to find the cause of the ulcer. Finding a cause will help determine the best treatment. Tests and procedures may include:  Blood tests, stool tests, or breath tests to check for the bacterium H. pylori.  An upper gastrointestinal (GI) series of the esophagus, stomach, and small intestine.  An endoscopy to examine the esophagus, stomach, and small intestine.  A biopsy. TREATMENT  Treatment may include:  Eliminating the cause of the ulcer, such as smoking, NSAIDs, or alcohol.  Medicines to reduce the amount of acid in your digestive tract.  Antibiotic medicines if the ulcer is caused by the H. pylori bacterium.  An upper endoscopy to treat a bleeding ulcer.  Surgery if the bleeding is severe or if the ulcer created a hole somewhere in the digestive system. HOME CARE INSTRUCTIONS   Avoid tobacco, alcohol, and caffeine. Smoking can increase the acid in the stomach, and continued smoking will impair the healing of ulcers.  Avoid foods and drinks that seem to cause discomfort or aggravate your ulcer.  Only take medicines as directed by your caregiver. Do not substitute over-the-counter medicines for prescription medicines without talking to your caregiver.  Keep any follow-up appointments and tests as directed. SEEK MEDICAL CARE IF:   Your do not improve within 7 days of starting treatment.  You have ongoing indigestion  or heartburn. SEEK IMMEDIATE MEDICAL CARE IF:   You have sudden, sharp, or persistent abdominal pain.  You have bloody or dark black, tarry stools.  You vomit blood or vomit that looks like coffee grounds.  You become light-headed, weak, or feel faint.  You become sweaty or clammy. MAKE SURE YOU:   Understand these instructions.  Will watch your condition.  Will get help right away if you are not doing well or get worse. Document Released: 10/13/2000 Document Revised: 03/02/2014 Document Reviewed: 05/15/2012 [facility] Patient Information 2015 [place], [place]. This information is not intended to replace advice given to you by your health care provider. Make sure you discuss any questions you have with your health care provider. Alcohol Use Disorder Alcohol use disorder is a mental disorder. It is not a one-time incident of heavy drinking. Alcohol use disorder is the excessive and uncontrollable use of alcohol over time that leads to problems with functioning in one or more areas of daily living. People with this disorder risk harming themselves and others when they drink to excess. Alcohol use disorder also can cause other mental disorders, such as mood and anxiety disorders, and serious physical problems. People with alcohol use disorder often misuse other drugs.  Alcohol use disorder is common and widespread. Some people with this disorder drink alcohol to cope with or escape  from negative life events. Others drink to relieve chronic pain or symptoms of mental illness. People with a family history of alcohol use disorder are at higher risk of losing control and using alcohol to excess.  SYMPTOMS  Signs and symptoms of alcohol use disorder may include the following:   Consumption ofalcohol inlarger amounts or over a longer period of time than intended.  Multiple unsuccessful attempts to cutdown or control alcohol use.   A great deal of time spent obtaining alcohol, using alcohol, or  recovering from the effects of alcohol (hangover).  A strong desire or urge to use alcohol (cravings).   Continued use of alcohol despite problems at work, school, or home because of alcohol use.   Continued use of alcohol despite problems in relationships because of alcohol use.  Continued use of alcohol in situations when it is physically hazardous, such as driving a car.  Continued use of alcohol despite awareness of a physical or psychological problem that is likely related to alcohol use. Physical problems related to alcohol use can involve the brain, heart, liver, stomach, and intestines. Psychological problems related to alcohol use include intoxication, depression, anxiety, psychosis, delirium, and dementia.   The need for increased amounts of alcohol to achieve the same desired effect, or a decreased effect from the consumption of the same amount of alcohol (tolerance).  Withdrawal symptoms upon reducing or stopping alcohol use, or alcohol use to reduce or avoid withdrawal symptoms. Withdrawal symptoms include:  Racing heart.  Hand tremor.  Difficulty sleeping.  Nausea.  Vomiting.  Hallucinations.  Restlessness.  Seizures. DIAGNOSIS Alcohol use disorder is diagnosed through an assessment by your health care provider. Your health care provider may start by asking three or four questions to screen for excessive or problematic alcohol use. To confirm a diagnosis of alcohol use disorder, at least two symptoms must be present within a 6641-month period. The severity of alcohol use disorder depends on the number of symptoms:  Mild--two or three.  Moderate--four or five.  Severe--six or more. Your health care provider may perform a physical exam or use results from lab tests to see if you have physical problems resulting from alcohol use. Your health care provider may refer you to a mental health professional for evaluation. TREATMENT  Some people with alcohol use disorder  are able to reduce their alcohol use to low-risk levels. Some people with alcohol use disorder need to quit drinking alcohol. When necessary, mental health professionals with specialized training in substance use treatment can help. Your health care provider can help you decide how severe your alcohol use disorder is and what type of treatment you need. The following forms of treatment are available:   Detoxification. Detoxification involves the use of prescription medicines to prevent alcohol withdrawal symptoms in the first week after quitting. This is important for people with a history of symptoms of withdrawal and for heavy drinkers who are likely to have withdrawal symptoms. Alcohol withdrawal can be dangerous and, in severe cases, cause death. Detoxification is usually provided in a hospital or in-patient substance use treatment facility.  Counseling or talk therapy. Talk therapy is provided by substance use treatment counselors. It addresses the reasons people use alcohol and ways to keep them from drinking again. The goals of talk therapy are to help people with alcohol use disorder find healthy activities and ways to cope with life stress, to identify and avoid triggers for alcohol use, and to handle cravings, which can cause relapse.  Medicines.Different  medicines can help treat alcohol use disorder through the following actions:  Decrease alcohol cravings.  Decrease the positive reward response felt from alcohol use.  Produce an uncomfortable physical reaction when alcohol is used (aversion therapy).  Support groups. Support groups are run by people who have quit drinking. They provide emotional support, advice, and guidance. These forms of treatment are often combined. Some people with alcohol use disorder benefit from intensive combination treatment provided by specialized substance use treatment centers. Both inpatient and outpatient treatment programs are available. Document  Released: 11/23/2004 Document Revised: 03/02/2014 Document Reviewed: 01/23/2013 [facility] Patient Information 2015 [place], [place]. This information is not intended to replace advice given to you by your health care provider. Make sure you discuss any questions you have with your health care provider.  Emergency Department Resource Guide 1) Find a Doctor and Pay Out of Pocket Although you won't have to find out who is covered by your insurance plan, it is a good idea to ask around and get recommendations. You will then need to call the office and see if the doctor you have chosen will accept you as a new patient and what types of options they offer for patients who are self-pay. Some doctors offer discounts or will set up payment plans for their patients who do not have insurance, but you will need to ask so you aren't surprised when you get to your appointment.  2) Contact Your Local Health Department Not all health departments have doctors that can see patients for sick visits, but many do, so it is worth a call to see if yours does. If you don't know where your local health department is, you can check in your phone book. The CDC also has a tool to help you locate your state's health department, and many state websites also have listings of all of their local health departments.  3) Find a Walk-in Clinic If your illness is not likely to be very severe or complicated, you may want to try a walk in clinic. These are popping up all over the country in pharmacies, drugstores, and shopping centers. They're usually staffed by nurse practitioners or physician assistants that have been trained to treat common illnesses and complaints. They're usually fairly quick and inexpensive. However, if you have serious medical issues or chronic medical problems, these are probably not your best option.  No Primary Care Doctor: - Call Health Connect at  [phone] - they can help you locate a primary care doctor that   accepts your insurance, provides certain services, etc. - Physician Referral Service- [phone]  Chronic Pain Problems: Organization         Address  Phone   Notes  [facility]  [phone] Patients need to be referred by their primary care doctor.   Medication Assistance: Organization         Address  Phone   Notes  [facility] Medication [facility] [address] [place], [place] [address] [phone] --Must be a resident of [facility] -- Must have NO insurance coverage whatsoever (no Medicaid/ Medicare, etc.) -- The pt. MUST have a primary care doctor that directs their care regularly and follows them in the community   [facility]  [phone]   [employer]  [phone]    Agencies that provide inexpensive medical care: Organization         Address  Phone   Notes  [facility]  [phone]  [facility]    [phone]   [facility] [address] [place], [place] [address] [phone]   [facility] [address], [place] [phone]) [phone]   [facility]    [phone]   [facility]    [phone]   [facility] and [facility]  [address], [place] Phone:  [phone], Fax:  [phone] Hours of Operation:  9 am - 6 pm, M-F.  Also accepts Medicaid/Medicare and self-pay.  [facility]  [address], [place] Phone: [phone], Fax: [phone]. Hours of Operation:  8:30 am - 5:30 pm, M-F.  Also accepts Medicaid and self-pay.  [facility] [place] [address], [place] Phone: [phone]) [phone]   [facility] [address], [place], [place] [phone], Ext. [phone] Mondays & Thursdays: 7-9 AM.  First 15 patients are seen on a first come, first serve basis.    Medicaid-accepting [facility] Providers:  Organization          Address  Phone   Notes  [facility] [address], [place] [phone] Also accepts self-pay patients.  [facility] [address], [place]  [phone]   [facility] [address], [place] [phone]   [facility] [address] [place] [address], [place] [phone]   Renaye RakersVeita Bland [address], [place]   [phone] Only accepts [place] Access [place] patients after they have their name applied to their card.   Self-Pay (no insurance) in [facility] [place]:  Organization         Address  Phone   Notes  Sickle Cell Patients, [facility] [address], [place] [phone]   [facility] [address], [place] [phone]   [facility] [place]  [address], [place] [phone]   [facility]/Dr. Osei-Bonsu  [address] [place] [address], [place] or [id number], [place] [phone] Phone number for both [place] and [place] locations is the same.  [facility] [address], [place] [phone]) [phone]   [facility] [address] [place] [address], [place] or [address] [phone]) [phone]   [facility] [address], [place] [phone], phone; [phone], fax Sees patients 1st and 3rd Saturday of every month.  Must not qualify for public or private insurance (i.e. Medicaid, Medicare, [place] Health Choice, Veterans' Benefits)  Household income should be no more than 200% of the poverty level The clinic cannot treat you if you are pregnant or think you are pregnant  Sexually transmitted diseases are not treated at the clinic.    Dental Care: Organization         Address  Phone  Notes  [facility] Department of [facility] [address],  [place] [phone] Accepts children up to age 26 who are enrolled in [place] or [place] Health Choice; pregnant women with a Medicaid card; and children who have applied for Medicaid or [place] Health Choice, but were declined, whose parents can pay a reduced fee at time of service.  [facility] Department of [facility] [place]  [address],  [place] [phone]) [phone] Accepts children up to age 321 who are enrolled in Medicaid or [place] Health Choice; pregnant women with a Medicaid card; and children who have applied for Medicaid or [place] Health Choice, but were declined, whose parents can pay a reduced fee at time of service.  [facility]  [address], [place] [phone]) [phone] Patients are seen by appointment only. Walk-ins are not accepted. [facility] will see patients 26 years of age and older. Monday - Tuesday (8am-5pm) Most Wednesdays (8:30-5pm) $30 per visit, cash only  [facility]  [address], [facility] [place] [phone] Patients are seen by appointment only. Walk-ins are not accepted. [facility] will see patients 26 years of age and older. One Wednesday Evening (Monthly: Volunteer Based).  $30 per visit, cash only  [facility]  [phone] for adults; Children under age 374, call Graduate Pediatric Dentistry at [phone]. Children aged 134-14, please call [phone] to request a pediatric application.  Dental services are provided in all areas of dental care including fillings, crowns and bridges, complete and partial dentures, implants, gum treatment, root canals, and extractions. Preventive care is also provided. Treatment is provided to both adults and children. Patients are selected via a lottery and there is often a waiting list.   [facility] [address], [place]  [phone] [email or web address]   [facility] [address], [place], [place]  [phone], Ext. [phone] Second and Fourth Thursday of each month, opens at 6:30 AM; Clinic ends at 9 AM.  Patients are seen on a first-come first-served basis, and a limited number are seen during each clinic.   [facility]  [address], [place], [place] [phone]) [phone]   Eligibility Requirements You must have lived in [place], [place], or [place] counties for at least the last three months.   You cannot be eligible for state or federal sponsored National Cityhealthcare insurance, including CIGNAVeterans Administration, [place]icaid, or [employer] EntertainmentMedicare.   You generally cannot be eligible for healthcare insurance through your employer.    How to apply: Eligibility screenings are held every Tuesday and Wednesday afternoon from 1:00 pm until 4:00 pm. You do not need an appointment for the interview!  [facility] [address], [place], [place] [phone]   [facility]  [phone]   [facility]  [phone]   [facility]  [phone]    Behavioral Health Resources in the Community: Intensive Outpatient Programs Organization         Address  Phone  Notes  [facility] [address], [place], [place] [phone]   [facility] [address], [place], [place] [phone]   [facility] [address], [place], [place]  [phone]   [facility] [address],  [place], [place] [phone] or [phone]   Substance Abuse Resources Organization         Address  Phone  Notes  [facility]  [phone]   [facility]  [phone]   [facility]  [phone]   [facility]  [phone]   [facility]idential [facility] Outpatient Substance Abuse Program  [phone]   Psychological Services Organization         Address  Phone  Notes  [facility]  [phone]- [phone]   [facility]  [phone]- [phone]  [facility] [address] [place] [phone] or [phone]    Mobile Crisis Teams Organization         Address  Phone  Notes  [facility], Mobile Crisis Care Unit  [phone]   [facility]  [address] [place], [place] [phone]   Doristine Locks [address] [place] [phone]    Self-Help/Support Groups Organization         Address  Phone             Notes  [facility] [place] - variety of support groups  [phone] Call for more information  Narcotics Anonymous (NA), [facility] [address], [employer] [place]  2 meetings at this location   Statistician         Address  Phone  Notes  [facility] [address],    [place]  [phone]   [facility]  [address] [place] [address], [place], [place] [phone]   [facility] [address] [place], [place] [phone] Admissions: 8am-3pm M-F  [facility] [address]    [place], [place] [phone]   [facility] [address] [place], [place], [place] [phone]   [facility] [address]  [place], [place] [phone]   [facility] Intensive Outpatient [address], [place], [place] [phone]   [facility] ([facility] [address] [place].,  [place], [place] [phone] or [phone]   [facility] ([facility] [address], [place], [place] [phone] Accepts Medicaid  [facility] [address],  [place] [phone] Substance Abuse/Addiction Treatment   [facility]         Address  Phone  Notes  [facility]  [phone]   Angie Fava, PhD [address] Knack [place], [place]   [phone] or [phone]   [facility]   [address] [place], [place] [phone]   [facility] [address], [place], [place] [phone] Insurance/Medicaid/sponsorship through [facility] and Families [address]                                    [place], [place] [phone] Therapy/tele-psych/case  [facility] [address] [place], [place] [phone]    Dr. Lolly Mustache  [phone]   [facility]  [employer] [facility] Dept. 1) [address], [place] 2) [address], [place] 3)  [address], [place] [phone]  [phone]   [facility] Child Abuse Hotline 959-381-9032 or [phone] (After Hours)

## 2014-09-30 NOTE — ED Provider Notes (Signed)
CSN: [id number]     Arrival date & time 09/29/14  2352 History   First MD Initiated Contact with Patient 09/30/14 0049     Chief Complaint  Patient presents with  . Hematemesis    (Consider location/radiation/quality/duration/timing/severity/associated sxs/prior Treatment) HPI Comments: 28 year old male with a history of alcohol abuse and liver damage presents to the emergency department for further evaluation of hematemesis. Patient states that he had 2 episodes of hematemesis characterized as bright red blood mixed with stomach contents. Last episode was 20 minutes prior to arrival. He states he has been experiencing some throbbing, nonradiating abdominal pain with symptoms. He also states that he is nauseous. Patient had two 40 ounce beers as well as 1/5 of vodka. He has been binge drinking over the past 4 days. He denies associated fever, chest pain, shortness of breath, urinary symptoms, syncope, history of abdominal surgeries, melena, and hematochezia.  The history is provided by the patient. No language interpreter was used.    Past Medical History  Diagnosis Date  . No significant medical problems   . Liver damage   . Alcohol abuse    History reviewed. No pertinent past surgical history. History reviewed. No pertinent family history. History  Substance Use Topics  . Smoking status: Current Every Day Smoker -- 0.50 packs/day    Types: Cigarettes  . Smokeless tobacco: Never Used  . Alcohol Use: 3.6 oz/week    6 Cans of beer per week     Comment: heavy- 5-6 40s daily and a pint of liquor daily    Review of Systems  Gastrointestinal: Positive for nausea, vomiting and abdominal pain.  All other systems reviewed and are negative.   Allergies  Peanut butter flavor; Peanut-containing drug products; and Other  Home Medications   Prior to Admission medications   Medication Sig Start Date End Date Taking? Authorizing Provider  tetrahydrozoline 0.05 % ophthalmic solution Place  1-2 drops into both eyes as needed (for eye irritation.).   Yes Historical Provider, MD  traZODone (DESYREL) 50 MG tablet Take 1 tablet (50 mg total) by mouth at bedtime and may repeat dose one time if needed. For sleep 06/12/14  Yes Sanjuana KavaAgnes I Nwoko, NP  famotidine (PEPCID) 20 MG tablet Take 1 tablet (20 mg total) by mouth 2 (two) times daily. 09/30/14   Antony MaduraKelly Bo Rogue, PA-C  LORazepam (ATIVAN) 1 MG tablet Take 1 tablet (1 mg total) by mouth 3 (three) times daily as needed for anxiety (For anxiety and tremors). 09/30/14   Antony MaduraKelly Luciel Brickman, PA-C  sucralfate (CARAFATE) 1 GM/10ML suspension Take 10 mLs (1 g total) by mouth 4 (four) times daily -  with meals and at bedtime. 09/30/14   Antony MaduraKelly Belvin Gauss, PA-C   BP 111/65 mmHg  Pulse 88  Temp(Src) 98 F (36.7 C) (Oral)  Resp 16  SpO2 96%   Physical Exam  Constitutional: He is oriented to person, place, and time. He appears well-developed and well-nourished. No distress.  Nontoxic/nonseptic appearing  HENT:  Head: Normocephalic and atraumatic.  Oropharynx clear.  Eyes: Conjunctivae and EOM are normal. No scleral icterus.  Neck: Normal range of motion.  Cardiovascular: Normal rate, regular rhythm and normal heart sounds.   Pulmonary/Chest: Effort normal. No respiratory distress. He has no wheezes. He has no rales.  Respirations even and unlabored  Abdominal: Soft. He exhibits no distension. There is tenderness. There is no rebound and no guarding.  Tenderness to palpation in the epigastric region. No masses, involuntary guarding, or peritoneal signs. Abdomen soft.  Genitourinary:  Patient declines rectal exam  Musculoskeletal: Normal range of motion.  Neurological: He is alert and oriented to person, place, and time. He exhibits normal muscle tone. Coordination normal.  Skin: Skin is warm and dry. No rash noted. He is not diaphoretic. No erythema. No pallor.  Psychiatric: He has a normal mood and affect. His behavior is normal.  Nursing note and vitals  reviewed.   ED Course  Procedures (including critical care time) Labs Review Labs Reviewed  COMPREHENSIVE METABOLIC PANEL - Abnormal; Notable for the following:    Total Protein 8.7 (*)    AST 105 (*)    ALT 90 (*)    Anion gap 21 (*)    All other components within normal limits  CBC WITH DIFFERENTIAL    Imaging Review No results found.   EKG Interpretation None      MDM   Final diagnoses:  Alcoholic gastritis  Hematemesis with nausea  Epigastric pain    28 year old male presents to the emergency department for further evaluation of hematemesis. Patient has a history of alcohol abuse and has been binge drinking for the past 5 days. Patient noted to have epigastric tenderness to palpation without peritoneal signs or guarding. He has had no hematemesis since arrival in ED, over his approximately 4 hour stay. He was treated with IVF, protonix, GI cocktail, and zofran. He has been given Ativan to prevent any withdrawal symptoms. Abdomen is improved on reexamination with mild residual tenderness in his epigastrium. He is requesting to leave the ED as he is feeling much better.   Labs today appear at baseline. He does have an anion gap of 21 which is likely secondary to ETOH metabolism. Electrolytes and bicarb, themselves, are normal. Patient does always appear to live at an elevated anion gap as well. Believe he is stable for discharge today. Hematemesis presumed secondary to alcoholic gastritis/PUD. Low suspicion for esophageal varices; no hx of documented portal HTN. No persistent tachycardia, hypotension, anemia, or elevated BUN to suggest acute hemorrhage. Will tx as outpatient with Pepcid and Carafate. Will also prescribe short course of ativan for likely withdrawals. Resource guide provided and return precautions discussed. Patient agreeable to plan with no unaddressed concerns.   Filed Vitals:   09/29/14 2354 09/30/14 0202 09/30/14 0434  BP: 148/94 128/90 111/65  Pulse: 108  91 88  Temp: 98.8 F (37.1 C)  98 F (36.7 C)  TempSrc: Oral  Oral  Resp: 14 18 16   SpO2: 97% 100% 96%       Antony MaduraKelly Eileen Kangas, PA-C 09/30/14 0725  Ethelda ChickMartha K Linker, MD 09/30/14 [phone]

## 2014-12-08 ENCOUNTER — Encounter (HOSPITAL_COMMUNITY): Payer: Self-pay

## 2014-12-08 ENCOUNTER — Emergency Department (HOSPITAL_COMMUNITY)
Admission: EM | Admit: 2014-12-08 | Discharge: 2014-12-08 | Disposition: A | Discharge status: Home / Self Care | Payer: PRIVATE HEALTH INSURANCE | Attending: Emergency Medicine | Admitting: Emergency Medicine

## 2014-12-08 DIAGNOSIS — Z720 Tobacco use: Secondary | ICD-10-CM | POA: Insufficient documentation

## 2014-12-08 DIAGNOSIS — F10220 Alcohol dependence with intoxication, uncomplicated: Secondary | ICD-10-CM | POA: Insufficient documentation

## 2014-12-08 DIAGNOSIS — Z8719 Personal history of other diseases of the digestive system: Secondary | ICD-10-CM | POA: Insufficient documentation

## 2014-12-08 DIAGNOSIS — F1210 Cannabis abuse, uncomplicated: Secondary | ICD-10-CM | POA: Insufficient documentation

## 2014-12-08 LAB — COMPREHENSIVE METABOLIC PANEL
ALT: 131 U/L — ABNORMAL HIGH (ref 0–53)
AST: 130 U/L — ABNORMAL HIGH (ref 0–37)
Albumin: 4.6 g/dL (ref 3.5–5.2)
Alkaline Phosphatase: 68 U/L (ref 39–117)
Anion gap: 13 (ref 5–15)
BUN: 13 mg/dL (ref 6–23)
CO2: 25 mmol/L (ref 19–32)
Calcium: 8.6 mg/dL (ref 8.4–10.5)
Chloride: 103 mmol/L (ref 96–112)
Creatinine, Ser: 0.79 mg/dL (ref 0.50–1.35)
GFR calc Af Amer: >90 mL/min (ref >90)
GFR calc non Af Amer: >90 mL/min (ref >90)
Glucose, Bld: 87 mg/dL (ref 70–99)
Potassium: 4 mmol/L (ref 3.5–5.1)
Sodium: 141 mmol/L (ref 135–145)
Total Bilirubin: 0.9 mg/dL (ref 0.3–1.2)
Total Protein: 8.3 g/dL (ref 6.0–8.3)

## 2014-12-08 LAB — CBC WITH DIFFERENTIAL/PLATELET
Basophils Absolute: 0.1 10*3/uL (ref 0.0–0.1)
Basophils Absolute: 0.1 10*3/uL (ref 0.0–0.1)
Basophils Relative: 1 % (ref 0–1)
Basophils Relative: 1 % (ref 0–1)
Eosinophils Absolute: 0 10*3/uL (ref 0.0–0.7)
Eosinophils Absolute: 0.1 10*3/uL (ref 0.0–0.7)
Eosinophils Relative: 1 % (ref 0–5)
Eosinophils Relative: 2 % (ref 0–5)
HCT: 43.1 % (ref 39.0–52.0)
HCT: 43.7 % (ref 39.0–52.0)
Hemoglobin: 15.2 g/dL (ref 13.0–17.0)
Hemoglobin: 15.1 g/dL (ref 13.0–17.0)
LYMPHS PCT: 46 % (ref 12–46)
Lymphocytes Relative: 45 % (ref 12–46)
Lymphs Abs: 2.7 10*3/uL (ref 0.7–4.0)
Lymphs Abs: 2.7 10*3/uL (ref 0.7–4.0)
MCH: 33.5 pg (ref 26.0–34.0)
MCH: 33.6 pg (ref 26.0–34.0)
MCHC: 35.3 g/dL (ref 30.0–36.0)
MCHC: 34.6 g/dL (ref 30.0–36.0)
MCV: 94.9 fL (ref 78.0–100.0)
MCV: 97.1 fL (ref 78.0–100.0)
Monocytes Absolute: 0.5 10*3/uL (ref 0.1–1.0)
Monocytes Absolute: 0.6 10*3/uL (ref 0.1–1.0)
Monocytes Relative: 8 % (ref 3–12)
Monocytes Relative: 10 % (ref 3–12)
Neutro Abs: 2.7 10*3/uL (ref 1.7–7.7)
Neutro Abs: 2.5 10*3/uL (ref 1.7–7.7)
Neutrophils Relative %: 45 % (ref 43–77)
Neutrophils Relative %: 41 % — ABNORMAL LOW (ref 43–77)
PLATELETS: 310 10*3/uL (ref 150–400)
Platelets: 327 10*3/uL (ref 150–400)
RBC: 4.54 MIL/uL (ref 4.22–5.81)
RBC: 4.5 MIL/uL (ref 4.22–5.81)
RDW: 13.4 % (ref 11.5–15.5)
RDW: 13.6 % (ref 11.5–15.5)
WBC: 6 10*3/uL (ref 4.0–10.5)
WBC: 6 10*3/uL (ref 4.0–10.5)

## 2014-12-08 LAB — RAPID URINE DRUG SCREEN, HOSP PERFORMED
Amphetamines: NOT DETECTED
Barbiturates: NOT DETECTED
Benzodiazepines: NOT DETECTED
Cocaine: NOT DETECTED
Opiates: NOT DETECTED
Tetrahydrocannabinol: POSITIVE — AB

## 2014-12-08 LAB — ETHANOL
Alcohol, Ethyl (B): 398 mg/dL — ABNORMAL HIGH (ref 0–9)
Alcohol, Ethyl (B): 317 mg/dL — ABNORMAL HIGH (ref 0–9)

## 2014-12-08 MED ORDER — ONDANSETRON HCL 4 MG PO TABS: 4.0000 mg | ORAL_TABLET | Freq: Three times a day (TID) | ORAL | Status: DC | PRN

## 2014-12-08 MED ORDER — FAMOTIDINE 20 MG PO TABS: 20.0000 mg | ORAL_TABLET | Freq: Two times a day (BID) | ORAL | Status: DC

## 2014-12-08 MED ORDER — THIAMINE HCL 100 MG/ML IJ SOLN: 100.0000 mg | Freq: Every day | INTRAMUSCULAR | Status: DC

## 2014-12-08 MED ORDER — LORAZEPAM 1 MG PO TABS: 0.0000 mg | ORAL_TABLET | Freq: Four times a day (QID) | ORAL | Status: DC

## 2014-12-08 MED ORDER — TRAZODONE HCL 50 MG PO TABS: 50.0000 mg | ORAL_TABLET | Freq: Every evening | ORAL | Status: DC | PRN

## 2014-12-08 MED ORDER — VITAMIN B-1 100 MG PO TABS: 100.0000 mg | ORAL_TABLET | Freq: Every day | ORAL | Status: DC

## 2014-12-08 MED ORDER — LORAZEPAM 1 MG PO TABS: 0.0000 mg | ORAL_TABLET | Freq: Two times a day (BID) | ORAL | Status: DC

## 2014-12-08 NOTE — ED Notes (Signed)
Pt requesting detox from ETOH [facility], last drink was 2 hours ago and has been drinking for two days

## 2014-12-08 NOTE — ED Notes (Signed)
Bed: WLPT4 Expected date:  Expected time:  Means of arrival:  Comments: EMS vomited x 2-drinking-hx hepatitis and alcoholism

## 2014-12-08 NOTE — BH Assessment (Signed)
Writer spoke w/ EDP Kohut. Kohut will remove TTS consult as pt is too intoxicated for assessment - BAL was 398 at 0400. Oncoming EDP may order new consult when pt less intoxicated.  Evette Cristalaroline Paige Dempsy Damiano, [place] Assessment Counselor

## 2014-12-08 NOTE — ED Notes (Signed)
First contact with patient. Pt alert to verbal stimuli.

## 2014-12-08 NOTE — ED Provider Notes (Signed)
CSN: [id number]     Arrival date & time 12/08/14  0106 History   First MD Initiated Contact with Patient 12/08/14 0253     Chief Complaint  Patient presents with  . Emesis  . Delirium Tremens (DTS)     (Consider location/radiation/quality/duration/timing/severity/associated sxs/prior Treatment) HPI   28yM with etoh abuse. Requesting detox. Pt highly intoxicated. Difficult to get much additional history. Does deny SI/HI. Denies ingestion aside from alcohol. No external signs of trauma. Reportedly vomiting prior to arrival.   Past Medical History  Diagnosis Date  . Liver damage   . Alcohol abuse    History reviewed. No pertinent past surgical history. History reviewed. No pertinent family history. History  Substance Use Topics  . Smoking status: Current Every Day Smoker -- 0.50 packs/day    Types: Cigarettes  . Smokeless tobacco: Never Used  . Alcohol Use: 3.6 oz/week    6 Cans of beer per week     Comment: heavy- 5-6 40s daily and a pint of liquor daily    Review of Systems  Level 5 caveat because pt is drunk.  Allergies  Peanut butter flavor; Peanut-containing drug products; and Other  Home Medications   Prior to Admission medications   Medication Sig Start Date End Date Taking? Authorizing Provider  LORazepam (ATIVAN) 1 MG tablet Take 1 tablet (1 mg total) by mouth 3 (three) times daily as needed for anxiety (For anxiety and tremors). 09/30/14  Yes Antony MaduraKelly Humes, PA-C  tetrahydrozoline 0.05 % ophthalmic solution Place 1-2 drops into both eyes as needed (for eye irritation.).   Yes Historical Provider, MD  traZODone (DESYREL) 50 MG tablet Take 1 tablet (50 mg total) by mouth at bedtime and may repeat dose one time if needed. For sleep 06/12/14  Yes Sanjuana KavaAgnes I Nwoko, NP  famotidine (PEPCID) 20 MG tablet Take 1 tablet (20 mg total) by mouth 2 (two) times daily. Patient not taking: Reported on 12/08/2014 09/30/14   Antony MaduraKelly Humes, PA-C  sucralfate (CARAFATE) 1 GM/10ML suspension Take  10 mLs (1 g total) by mouth 4 (four) times daily -  with meals and at bedtime. Patient not taking: Reported on 12/08/2014 09/30/14   Antony MaduraKelly Humes, PA-C   BP 105/57 mmHg  Pulse 79  Temp(Src) 98.2 F (36.8 C) (Oral)  Resp 16  SpO2 95% Physical Exam  Constitutional: He appears well-developed and well-nourished. No distress.  Laying in bed. Appears to be sleeping.   HENT:  Head: Normocephalic and atraumatic.  Eyes: Right eye exhibits no discharge. Left eye exhibits no discharge.  Conjunctival injection  Neck: Neck supple.  Cardiovascular: Normal rate, regular rhythm and normal heart sounds.  Exam reveals no gallop and no friction rub.   No murmur heard. Pulmonary/Chest: Effort normal and breath sounds normal. No respiratory distress.  Abdominal: Soft. He exhibits no distension. There is no tenderness.  Musculoskeletal: He exhibits no edema or tenderness.  Neurological:  Very drowsy. Will open eyes when yelled at and with tactile stimulation. Will follow simple commands. Cannot complete more complex tasks as he quickly falls asleep. Speech slurred but understandable. Psychomotor slowing consistent with alcohol ingestion. No focal motor deficit noted.   Skin: Skin is warm and dry.  Psychiatric: He has a normal mood and affect. His behavior is normal. Thought content normal.  Nursing note and vitals reviewed.   ED Course  Procedures (including critical care time) Labs Review Labs Reviewed  COMPREHENSIVE METABOLIC PANEL - Abnormal; Notable for the following:    AST 130 (*)  ALT 131 (*)    All other components within normal limits  ETHANOL - Abnormal; Notable for the following:    Alcohol, Ethyl (B) 398 (*)    All other components within normal limits  CBC WITH DIFFERENTIAL/PLATELET  URINE RAPID DRUG SCREEN (HOSP PERFORMED)    Imaging Review No results found.   EKG Interpretation None      MDM   Final diagnoses:  Alcohol dependence with uncomplicated intoxication   28yM  with etoh abuse and requesting detox. Unreliable historian at this point but denying HI/SI. Afebrile. HD stable. No further vomiting at this point. Too intoxicated currently to tell me if has ever had withdrawal seizure or DT.     Raeford Razor, MD 12/08/14 [phone]

## 2014-12-08 NOTE — Discharge Instructions (Signed)
Finding Treatment for Alcohol and Drug Addiction It can be hard to find the right place to get professional treatment. Here are some important things to consider:  There are different types of treatment to choose from.  Some programs are live-in (residential) while others are not (outpatient). Sometimes a combination is offered.  No single type of program is right for everyone.  Most treatment programs involve a combination of education, counseling, and a 12-step, spiritually-based approach.  There are non-spiritually based programs (not 12-step).  Some treatment programs are government sponsored. They are geared for patients without private insurance.  Treatment programs can vary in many respects such as:  Cost and types of insurance accepted.  Types of on-site medical services offered.  Length of stay, setting, and size.  Overall philosophy of treatment. A person may need specialized treatment or have needs not addressed by all programs. For example, adolescents need treatment appropriate for their age. Other people have secondary disorders that must be managed as well. Secondary conditions can include mental illness, such as depression or diabetes. Often, a period of detoxification from alcohol or drugs is needed. This requires medical supervision and not all programs offer this. THINGS TO CONSIDER WHEN SELECTING A TREATMENT PROGRAM   Is the program certified by the appropriate government agency? Even private programs must be certified and employ certified professionals.  Does the program accept your insurance? If not, can a payment plan be set up?  Is the facility clean, organized, and well run? Do they allow you to speak with graduates who can share their treatment experience with you? Can you tour the facility? Can you meet with staff?  Does the program meet the full range of individual needs?  Does the treatment program address sexual orientation and physical disabilities?  Do they provide age, gender, and culturally appropriate treatment services?  Is treatment available in languages other than English?  Is long-term aftercare support or guidance encouraged and provided?  Is assessment of an individual's treatment plan ongoing to ensure it meets changing needs?  Does the program use strategies to encourage reluctant patients to remain in treatment long enough to increase the likelihood of success?  Does the program offer counseling (individual or group) and other behavioral therapies?  Does the program offer medicine as part of the treatment regimen, if needed?  Is there ongoing monitoring of possible relapse? Is there a defined relapse prevention program? Are services or referrals offered to family members to ensure they understand addiction and the recovery process? This would help them support the recovering individual.  Are 12-step meetings held at the center or is transport available for patients to attend outside meetings? In countries outside of the [place], Magazine features editorsee local directories for contact information for services in your area. Document Released: 09/14/2005 Document Revised: 01/08/2012 Document Reviewed: 03/26/2008 [facility] Patient Information 2015 [place], [place]. This information is not intended to replace advice given to you by your health care provider. Make sure you discuss any questions you have with your health care provider.  Alcohol Intoxication Alcohol intoxication occurs when the amount of alcohol that a person has consumed impairs his or her ability to mentally and physically function. Alcohol directly impairs the normal chemical activity of the brain. Drinking large amounts of alcohol can lead to changes in mental function and behavior, and it can cause many physical effects that can be harmful.  Alcohol intoxication can range in severity from mild to very severe. Various factors can affect the level of  intoxication that occurs,  such as the person's age, gender, weight, frequency of alcohol consumption, and the presence of other medical conditions (such as diabetes, seizures, or heart conditions). Dangerous levels of alcohol intoxication may occur when people drink large amounts of alcohol in a short period (binge drinking). Alcohol can also be especially dangerous when combined with certain prescription medicines or "recreational" drugs. SIGNS AND SYMPTOMS Some common signs and symptoms of mild alcohol intoxication include:  Loss of coordination.  Changes in mood and behavior.  Impaired judgment.  Slurred speech. As alcohol intoxication progresses to more severe levels, other signs and symptoms will appear. These may include:  Vomiting.  Confusion and impaired memory.  Slowed breathing.  Seizures.  Loss of consciousness. DIAGNOSIS  Your health care provider will take a medical history and perform a physical exam. You will be asked about the amount and type of alcohol you have consumed. Blood tests will be done to measure the concentration of alcohol in your blood. In many places, your blood alcohol level must be lower than 80 mg/dL (8.29%0.08%) to legally drive. However, many dangerous effects of alcohol can occur at much lower levels.  TREATMENT  People with alcohol intoxication often do not require treatment. Most of the effects of alcohol intoxication are temporary, and they go away as the alcohol naturally leaves the body. Your health care provider will monitor your condition until you are stable enough to go home. Fluids are sometimes given through an IV access tube to help prevent dehydration.  HOME CARE INSTRUCTIONS  Do not drive after drinking alcohol.  Stay hydrated. Drink enough water and fluids to keep your urine clear or pale yellow. Avoid caffeine.   Only take over-the-counter or prescription medicines as directed by your health care provider.  SEEK MEDICAL CARE IF:   You have persistent  vomiting.   You do not feel better after a few days.  You have frequent alcohol intoxication. Your health care provider can help determine if you should see a substance use treatment counselor. SEEK IMMEDIATE MEDICAL CARE IF:   You become shaky or tremble when you try to stop drinking.   You shake uncontrollably (seizure).   You throw up (vomit) blood. This may be bright red or may look like black coffee grounds.   You have blood in your stool. This may be bright red or may appear as a black, tarry, bad smelling stool.   You become lightheaded or faint.  MAKE SURE YOU:   Understand these instructions.  Will watch your condition.  Will get help right away if you are not doing well or get worse. Document Released: 07/26/2005 Document Revised: 06/18/2013 Document Reviewed: 03/21/2013 [facility] Patient Information 2015 [place], [place]LL[place]. This information is not intended to replace advice given to you by your health care provider. Make sure you discuss any questions you have with your health care provider.  Emergency Department Resource Guide 1) Find a Doctor and Pay Out of Pocket Although you won't have to find out who is covered by your insurance plan, it is a good idea to ask around and get recommendations. You will then need to call the office and see if the doctor you have chosen will accept you as a new patient and what types of options they offer for patients who are self-pay. Some doctors offer discounts or will set up payment plans for their patients who do not have insurance, but you will need to ask so you aren't surprised when you get  to your appointment.  2) Contact Your Local Health Department Not all health departments have doctors that can see patients for sick visits, but many do, so it is worth a call to see if yours does. If you don't know where your local health department is, you can check in your phone book. The CDC also has a tool to help you locate your state's  health department, and many state websites also have listings of all of their local health departments.  3) Find a Walk-in Clinic If your illness is not likely to be very severe or complicated, you may want to try a walk in clinic. These are popping up all over the country in pharmacies, drugstores, and shopping centers. They're usually staffed by nurse practitioners or physician assistants that have been trained to treat common illnesses and complaints. They're usually fairly quick and inexpensive. However, if you have serious medical issues or chronic medical problems, these are probably not your best option.  No Primary Care Doctor: - Call Health Connect at  [phone] - they can help you locate a primary care doctor that  accepts your insurance, provides certain services, etc. - Physician Referral Service- [phone]  Chronic Pain Problems: Organization         Address  Phone   Notes  [facility]  [phone] Patients need to be referred by their primary care doctor.   Medication Assistance: Organization         Address  Phone   Notes  [facility] Medication [facility] [address] [place], [place] [address] [phone] --Must be a resident of [facility] -- Must have NO insurance coverage whatsoever (no Medicaid/ Medicare, etc.) -- The pt. MUST have a primary care doctor that directs their care regularly and follows them in the community   Med[facility]ist  [phone]   [employer]  [phone]    Agencies that provide inexpensive medical care: Organization         Address  Phone   Notes  [facility]  [phone]   [facility]    [phone]   [facility] [address] [place], [place] [address] [phone]   [facility] [address] [place] [place] [phone]   Planned Parenthood    [phone]   [facility]    [phone]    [facility]  [address], [place] Phone:  [phone], Fax:  [phone] Hours of Operation:  9 am - 6 pm, M-F.  Also accepts Medicaid/Medicare and self-pay.  [facility]  [address],  Phone: [phone], Fax: [phone]. Hours of Operation:  8:30 am - 5:30 pm, M-F.  Also accepts Medicaid and self-pay.  [facility] [place] [address], [place] Phone: [phone]   [facility] [address] Natasha Bence [place], [place] [phone], Ext. [phone] Mondays & Thursdays: 7-9 AM.  First 15 patients are seen on a first come, first serve basis.    Medicaid-accepting [facility] Providers:  Organization         Address  Phone   Notes  [facility] [address],  [phone] Also accepts self-pay patients.  [facility] [address] Laurell Josephs [place], [place]  [phone]   [facility] [phone]  [address], [place] [phone]   [facility] [address], [place] [phone]   Renaye Rakers [address], [place]   [phone] Only accepts [place] Access [place] patients after they have their name applied to their card.   Self-Pay (no insurance) in [facility]:  Organization         Address  Phone   Notes  Sickle Cell Patients, [facility] [address] [place], [place] [phone]   [facility] [address] [place], [place] [phone]   [facility] [place]  [address], [place] [phone]   [facility]/Dr. Osei-Bonsu  [address], [place] or [address], [place] [phone] Phone number for both [place] and [place] locations is the same.  [facility] [address] [place] [phone]     [facility] [address], [place] or [address] [phone]   [facility] [address] [place] [phone], phone; [phone], fax Sees patients 1st and 3rd Saturday of every month.  Must not qualify for public or private insurance (i.e. Medicaid, Medicare, [place] Health Choice, Veterans' Benefits)  Household income should be no more than 200% of the poverty level The clinic cannot treat you if you are pregnant or think you are pregnant  Sexually transmitted diseases are not treated at the clinic.    Dental Care: Organization         Address  Phone  Notes  [facility] Department of [facility] [address] [place], [place] [phone] Accepts children up to age 48 who are enrolled in [place] or [place] Health Choice; pregnant women with a Medicaid card; and children who have applied for Medicaid or [place] Health Choice, but were declined, whose parents can pay a reduced fee at time of service.  [facility] of [facility]  [address], [place] [phone] Accepts children up to age 23 who are enrolled in [place] or [place] Health Choice; pregnant women with a Medicaid card; and children who have applied for Medicaid or [place] Health Choice, but were declined, whose parents can pay a reduced fee at time of service.  [facility]  [address] [place], [place] [phone] Patients are seen by appointment only. Walk-ins are not accepted. [facility] will see patients 38 years of age and older. Monday - Tuesday (8am-5pm) Most Wednesdays (8:30-5pm) $30 per visit, cash only  [facility] Adult Dental Access PROGRAM  [address], [facility] [phone] Patients are seen by appointment only. Walk-ins are not accepted. [facility] will see patients 48 years of age and older. One Wednesday Evening (Monthly: Volunteer Based).   $30 per visit, cash only  [employer]  [phone] for adults; Children under age 24, call Graduate Pediatric Dentistry at [phone]. Children aged 38-14, please call [phone] to request a pediatric application.  Dental services are provided in all areas of dental care including fillings, crowns and bridges, complete and partial dentures, implants, gum treatment, root canals, and extractions. Preventive care is also provided. Treatment is provided to both adults and children. Patients are selected via a lottery and there is often a waiting list.   [facility] [address], [place]  [phone] [email or web address]   [facility] [address] [place], [place] [phone], Ext. [phone] Second and Fourth Thursday of each month, opens at 6:30 AM; Clinic ends at 9 AM.  Patients are seen on a first-come first-served basis, and a limited number are seen during each clinic.   [facility]  [address] Hillard Danker [place], [place] [phone]   Eligibility Requirements You must have lived in [place], [place], or [place] counties for at least the last three months.   You cannot be eligible for state or federal sponsored [employer], including [employer], [place], or [employer].   You generally cannot be eligible for healthcare insurance through your employer.    How to apply: Eligibility screenings are held every Tuesday and Wednesday afternoon from 1:00 pm until 4:00 pm. You do not need an appointment for the interview!  [facility] [address], [place], [place]   [place]  [place] Department  [place]  [phone]    Behavioral Health Resources in the Community: Intensive Outpatient Programs Organization         Address  Phone  Notes  [place]. [address], [place], [place] [phone]   [facility] Outpatient [address] [place], [place]   ADS: [facility] Drug [facility] [address], [place], [place]   [place] [address]  [place], [place] or [phone]   Substance Abuse Resources Organization         Address  Phone  Notes  [facility] Drug Services  [phone]   [place]  [phone]   The [place]   Chinita Pester  [phone]   Residential & Outpatient Substance Abuse Program  [phone]   Psychological Services Organization         Address  Phone  Notes  [facility] [place]  [place]  [phone]   [place] [address] [place] or [phone]    Mobile Crisis Teams Organization         Address  Phone  Notes  [facility], Mobile Crisis Care Unit  [phone]   [facility]  [address] [place], [place]   [place] [address], [place] [phone]    Self-Help/Support Groups Organization         Address  Phone             Notes  [place]. of [place] - variety of support groups  [place] Call for more information  Narcotics Anonymous (NA), [facility] [address], [employer] [place]  2 meetings at this location   Special educational needs teacher         Address  Phone  Notes  [facility] [place],    [place]  [phone]   [facility]  [address], [place] [address], [place], [place]   [place], [place] [phone] Admissions: 8am-3pm M-F  Incentives Substance [place] [address]    [place], [place] [phone]   [facility] [address] [place], [place], [place]   The [place].,  [place], [place] [phone]   [facility] - Intensive Outpatient [address] Laurell Josephs [address], [place], [place] [phone]   [facility] ([facility] [address] [place]  [place], [place] [phone] or [phone]   [facility] ([facility]) [address], [place], [place] [phone] Accepts Medicaid  [facility] [place] [address],  [place] [phone] Substance Abuse/Addiction Treatment   [facility] Organization         Address  Phone  Notes  [facility]  [phone]   Angie Fava, PhD [address] Ervin Knack [place], [place]   [phone] or [phone]   [facility]   [address] [place], [place] [phone]   [facility] [address] [place], [place] [phone] Insurance/Medicaid/sponsorship through [facility] and Families [address]                                    [place], [place] [phone] Therapy/tele-psych/case  [facility] [address], [place] [phone]    Dr. Lolly Mustache  [phone]   [facility]  [employer] [facility] Dept. 1) [address], [place] 2) [address] [place] 3)  [address], [place] [phone]  [phone]   [facility] Child Abuse Hotline [phone] or [phone] (After Hours)

## 2014-12-08 NOTE — ED Notes (Signed)
Pt has a hx of alcoholism, he's been drinking today and has vomited blood twice in three hours

## 2014-12-08 NOTE — ED Notes (Signed)
Pt alertx4 resources given.

## 2014-12-08 NOTE — ED Provider Notes (Signed)
The patient is signed out by Dr. Juleen ChinaKohut for reevaluation and disposition. He reports at the time of the patient's arrival he was too intoxicated to give any meaningful history. He reports there had not been any statement of suicidal or homicidal ideation. The patient is now awake and well in appearance with clear mental status. The patient reports he has no recall of seeing Dr. Juleen ChinaKohut when he came to the emergency department initially and doesn't know what he might have told him in terms of his history or reason for coming to the emergency department. Now the patient reports that he came because he was vomiting and he reports that he had thrown up blood. This has happened on prior occasions as he has been seen in the emergency department for similar presentation. The patient does endorse that he is a daily and heavy alcohol user. At this time asked about a desire to stop drinking he is equivocal. He reports he knows that it is harming him and making him sick but he won't make a commitment to a stop time.  On physical examination the patient is well in appearance. His mental status is completely clear and his speech is clear and oriented. Head is normocephalic atraumatic, extraocular motions are intact. Lungs are clear without wheeze rhonchi rail, heart is regular without rub murmur gallop. The patient's abdomen is soft and nontender. Extremities are in good condition without evidence of deformity or acute injury. The patient has no tremor present all of his movements are very coordinated and purposeful. His general demeanor is calm and appropriate. His skin is warm and dry. The patient does request some medication for his "withdrawal symptoms".  At this time the plan will be for repeat alcohol level and a repeat CBC based on the patient's prior history of vomiting blood. There was no report from night nursing staff of vomiting since his arrival to the emergency department. There has been no vomiting since the  morning staff took over care.  The patient's vital signs are main stable and his hemoglobin has been in the 15 mg/dL range for years. There is no evidence of GI blood loss. The patient is counseled of his ongoing risk of bleeding, ulcer and liver damage.  Objectively the patient is calm and well without signs of acute withdrawal. The patient's cognitive function is normal and intact at this time. The patient has been counseled on alcohol abuse and long-term medical complications. Resources are provided for the patient to seek ongoing treatment. He is counseled to take a daily PPI  and strongly encouraged to seek ongoing outpatient treatment for long-standing alcohol dependency and medical complications of alcohol abuse.  Arby BarretteMarcy Ajay Strubel, MD 12/08/14 [phone]

## 2014-12-08 NOTE — ED Notes (Signed)
FSBS 77

## 2014-12-09 LAB — CBG MONITORING, ED: Glucose-Capillary: 77 mg/dL (ref 70–99)

## 2015-04-14 DIAGNOSIS — Z8719 Personal history of other diseases of the digestive system: Secondary | ICD-10-CM | POA: Insufficient documentation

## 2015-04-14 DIAGNOSIS — Y929 Unspecified place or not applicable: Secondary | ICD-10-CM | POA: Insufficient documentation

## 2015-04-14 DIAGNOSIS — S80211A Abrasion, right knee, initial encounter: Secondary | ICD-10-CM | POA: Insufficient documentation

## 2015-04-14 DIAGNOSIS — W010XXA Fall on same level from slipping, tripping and stumbling without subsequent striking against object, initial encounter: Secondary | ICD-10-CM | POA: Insufficient documentation

## 2015-04-14 DIAGNOSIS — Y999 Unspecified external cause status: Secondary | ICD-10-CM | POA: Insufficient documentation

## 2015-04-14 DIAGNOSIS — S60221A Contusion of right hand, initial encounter: Secondary | ICD-10-CM | POA: Insufficient documentation

## 2015-04-14 DIAGNOSIS — S0081XA Abrasion of other part of head, initial encounter: Secondary | ICD-10-CM | POA: Insufficient documentation

## 2015-04-14 DIAGNOSIS — Z720 Tobacco use: Secondary | ICD-10-CM | POA: Insufficient documentation

## 2015-04-14 DIAGNOSIS — Y939 Activity, unspecified: Secondary | ICD-10-CM | POA: Insufficient documentation

## 2015-04-14 DIAGNOSIS — S40211A Abrasion of right shoulder, initial encounter: Secondary | ICD-10-CM | POA: Insufficient documentation

## 2015-04-15 ENCOUNTER — Encounter (HOSPITAL_COMMUNITY): Payer: Self-pay | Admitting: Emergency Medicine

## 2015-04-15 ENCOUNTER — Emergency Department (HOSPITAL_COMMUNITY)
Admission: EM | Admit: 2015-04-15 | Discharge: 2015-04-15 | Disposition: A | Discharge status: Home / Self Care | Payer: PRIVATE HEALTH INSURANCE | Attending: Emergency Medicine | Admitting: Emergency Medicine

## 2015-04-15 ENCOUNTER — Emergency Department (HOSPITAL_COMMUNITY): Payer: PRIVATE HEALTH INSURANCE

## 2015-04-15 DIAGNOSIS — T07XXXA Unspecified multiple injuries, initial encounter: Secondary | ICD-10-CM

## 2015-04-15 DIAGNOSIS — S60221A Contusion of right hand, initial encounter: Secondary | ICD-10-CM

## 2015-04-15 MED ORDER — IBUPROFEN 800 MG PO TABS
800.0000 mg | ORAL_TABLET | Freq: Once | ORAL | Status: AC
  Administered 2015-04-15: 800 mg via ORAL
  Filled 2015-04-15: qty 1

## 2015-04-15 MED ORDER — BACITRACIN 500 UNIT/GM EX OINT
1.0000 "application " | TOPICAL_OINTMENT | Freq: Once | CUTANEOUS | Status: AC
  Administered 2015-04-15: 1 via TOPICAL
  Filled 2015-04-15: qty 28.4

## 2015-04-15 NOTE — Discharge Instructions (Signed)
For pain control please take ibuprofen (also known as Motrin or Advil) 800mg  (this is normally 4 over the counter pills) 3 times a day  for 5 days. Take with food to minimize stomach irritation.  Wash the affected area with soap and water and apply a thin layer of topical antibiotic ointment. Do this every 12 hours.   Do not use rubbing alcohol or hydrogen peroxide.                        Look for signs of infection: if you see redness, if the area becomes warm, if pain increases sharply, there is discharge (pus), if red streaks appear or you develop fever or vomiting, RETURN immediately to the Emergency Department  for a recheck.   Do not hesitate to return to the emergency room for any new, worsening or concerning symptoms.  Please obtain primary care using resource guide below. Let them know that you were seen in the emergency room and that they will need to obtain records for further outpatient management.   Abrasions An abrasion is a cut or scrape of the skin. Abrasions do not go through all layers of the skin. HOME CARE  If a bandage (dressing) was put on your wound, change it as told by your doctor. If the bandage sticks, soak it off with warm.  Wash the area with water and soap 2 times a day. Rinse off the soap. Pat the area dry with a clean towel.  Put on medicated cream (ointment) as told by your doctor.  Change your bandage right away if it gets wet or dirty.  Only take medicine as told by your doctor.  See your doctor within 24-48 hours to get your wound checked.  Check your wound for redness, puffiness (swelling), or yellowish-white fluid (pus). GET HELP RIGHT AWAY IF:   You have more pain in the wound.  You have redness, swelling, or tenderness around the wound.  You have pus coming from the wound.  You have a fever or lasting symptoms for more than 2-3 days.  You have a fever and your symptoms suddenly get worse.  You have a bad smell coming from the wound or  bandage. MAKE SURE YOU:   Understand these instructions.  Will watch your condition.  Will get help right away if you are not doing well or get worse. Document Released: 04/03/2008 Document Revised: 07/10/2012 Document Reviewed: 09/19/2011 [facility] Patient Information 2015 [place], [place]. This information is not intended to replace advice given to you by your health care provider. Make sure you discuss any questions you have with your health care provider.  Contusion A contusion is a deep bruise. Contusions happen when an injury causes bleeding under the skin. Signs of bruising include pain, puffiness (swelling), and discolored skin. The contusion may turn blue, purple, or yellow. HOME CARE   Put ice on the injured area.  Put ice in a plastic bag.  Place a towel between your skin and the bag.  Leave the ice on for 15-20 minutes, 03-04 times a day.  Only take medicine as told by your doctor.  Rest the injured area.  If possible, raise (elevate) the injured area to lessen puffiness. GET HELP RIGHT AWAY IF:   You have more bruising or puffiness.  You have pain that is getting worse.  Your puffiness or pain is not helped by medicine. MAKE SURE YOU:   Understand these instructions.  Will watch your condition.  Will get help right away if you are not doing well or get worse. Document Released: 04/03/2008 Document Revised: 01/08/2012 Document Reviewed: 08/21/2011 [facility] Patient Information 2015 [place], [place]. This information is not intended to replace advice given to you by your health care provider. Make sure you discuss any questions you have with your health care provider.   Emergency Department Resource Guide 1) Find a Doctor and Pay Out of Pocket Although you won't have to find out who is covered by your insurance plan, it is a good idea to ask around and get recommendations. You will then need to call the office and see if the doctor you have chosen will accept  you as a new patient and what types of options they offer for patients who are self-pay. Some doctors offer discounts or will set up payment plans for their patients who do not have insurance, but you will need to ask so you aren't surprised when you get to your appointment.  2) Contact Your Local Health Department Not all health departments have doctors that can see patients for sick visits, but many do, so it is worth a call to see if yours does. If you don't know where your local health department is, you can check in your phone book. The CDC also has a tool to help you locate your state's health department, and many state websites also have listings of all of their local health departments.  3) Find a Walk-in Clinic If your illness is not likely to be very severe or complicated, you may want to try a walk in clinic. These are popping up all over the country in pharmacies, drugstores, and shopping centers. They're usually staffed by nurse practitioners or physician assistants that have been trained to treat common illnesses and complaints. They're usually fairly quick and inexpensive. However, if you have serious medical issues or chronic medical problems, these are probably not your best option.  No Primary Care Doctor: - Call Health Connect at  [phone] - they can help you locate a primary care doctor that  accepts your insurance, provides certain services, etc. - Physician Referral Service- [phone]  Chronic Pain Problems: Organization         Address  Phone   Notes  [facility]  [phone] Patients need to be referred by their primary care doctor.   Medication Assistance: Organization         Address  Phone   Notes  [facility] Medication [facility] [address] [place] [place], [place] [address] [phone] --Must be a resident of [facility] -- Must have NO insurance coverage whatsoever (no Medicaid/ Medicare, etc.) -- The pt. MUST  have a primary care doctor that directs their care regularly and follows them in the community   [facility]  [phone]   [employer]  [phone]    Agencies that provide inexpensive medical care: Organization         Address  Phone   Notes  [facility]  [phone]   [facility]    [phone]   [facility] [address] [place], [place] [address] [phone]   [facility] [address] [place], [place] [phone]   [facility]anned Parenthood    [phone]   [facility]    [phone]   [facility]  [address], [place] Phone:  [phone],  Fax:  [phone] Hours of Operation:  9 am - 6 pm, M-F.  Also accepts Medicaid/Medicare and self-pay.  [facility]  [address], [place] Phone: [phone], Fax: [phone]. Hours of Operation:  8:30 am - 5:30 pm, M-F.  Also accepts Medicaid and self-pay.  [facility] [place] [address] [place] Phone: [phone]   [facility] [address] [place]asha [place], [place] [phone], Ext. [phone] Mondays & Thursdays: 7-9 AM.  First 15 patients are seen on a first come, first serve basis.    Medicaid-accepting [facility] Providers:  Organization         Address  Phone   Notes  [facility] [address], [place] [phone] Also accepts self-pay patients.  [facility] [address] Laurell Josephs [place], [place]  [phone]   [facility] [address], [place] [phone]   [facility] [address] [place] [phone]   Renaye Rakers [address], [place]   [phone] Only accepts [place] Access [place] patients after they have their name applied to their card.   Self-Pay (no insurance) in [facility]:  Organization         Address  Phone   Notes  Sickle Cell Patients, [facility] [address] [place], [place] [phone]   [facility] [address] [place], [place] [phone]   [facility] [place]  [address], [place] [phone]   [facility]/Dr. Osei-Bonsu  [address] [place] or [address], [place] [phone] Phone number for both [place] and [place] locations is the same.  [facility] [address] [place] [phone]   [facility] [address] [place] or [address] [phone]   [facility] [address] [place] [phone], phone; [phone], fax Sees patients 1st and 3rd Saturday of every month.  Must not qualify for public or private insurance (i.e. Medicaid, Medicare, [place] Health Choice, Veterans' Benefits)  Household income should be no more than 200% of the poverty level The clinic cannot treat you if you are pregnant or think you are pregnant  Sexually transmitted diseases are not treated at the clinic.    Dental Care: Organization         Address  Phone  Notes  [facility] Department of [facility] [address] [place], [place] [phone] Accepts children up to age 41 who are enrolled in [place] or [place] Health Choice; pregnant women with a Medicaid card; and children who have applied for Medicaid or [place] Health Choice, but were declined, whose parents can pay a reduced fee at time of service.  [facility] Department of [facility]  [address], [place] [phone] Accepts children up to age 32 who are enrolled in [place] or [place] Health Choice; pregnant women with a Medicaid card; and children who have applied for Medicaid or  Health Choice, but were declined, whose parents can  pay a reduced fee at time of service.  [facility]  [address] [place], [place] [phone] Patients are seen by appointment only. Walk-ins are not  accepted. [facility] will see patients 85 years of age and older. Monday - Tuesday (8am-5pm) Most Wednesdays (8:30-5pm) $30 per visit, cash only  [facility] Adult Dental Access PROGRAM  [address], [facility] [phone] Patients are seen by appointment only. Walk-ins are not accepted. [facility] will see patients 31 years of age and older. One Wednesday Evening (Monthly: Volunteer Based).  $30 per visit, cash only  [employer]  [phone] for adults; Children under age 11, call Graduate Pediatric Dentistry at [phone]. Children aged 33-14, please call [phone] to request a pediatric application.  Dental services are provided in all areas of dental care including fillings, crowns and bridges, complete and partial dentures, implants, gum treatment, root canals, and extractions. Preventive care is also provided. Treatment is provided to both adults and children. Patients are selected via a lottery and there is often a waiting list.   [facility] [address] [place]  [phone] [email or web address]   [facility] [address] [place], [place] [phone], Ext. [phone] Second and Fourth Thursday of each month, opens at 6:30 AM; Clinic ends at 9 AM.  Patients are seen on a first-come first-served basis, and a limited number are seen during each clinic.   [facility]  [address] Ether Griffins [place], [place] [phone]   Eligibility Requirements You must have lived in [place], [place], or [place] counties for at least the last three months.   You cannot be eligible for state or federal sponsored [place], including [employer], [place], or [employer].   You generally cannot be eligible for healthcare  insurance through your employer.    How to apply: Eligibility screenings are held every Tuesday and Wednesday afternoon from 1:00 pm until 4:00 pm. You do not need an appointment for the interview!  [facility] [address], [place], [place] [phone]   [facility] Health Department  [phone]   [facility] Health Department  [phone]   [facility] Health Department  [phone]    Behavioral Health Resources in the Community: Intensive Outpatient Programs Organization         Address  Phone  Notes  [facility] [address] [place], [place] [phone]   [facility] Outpatient [address] [place], [place] [phone]   [facility] [address], [place], [place]  [phone]   [facility] [address],  [place], [place] [phone] or [phone]   Substance Abuse Resources Organization         Address  Phone  Notes  [facility]  [phone]   [facility]  [phone]   [facility] [place]  [phone]   Floydene Flock  [phone]   Residential & Outpatient Substance Abuse Program  [phone]   Psychological Services Organization         Address  Phone  Notes  [facility] Behavioral Health  [phone]   [facility] Services  [phone]   [facility] Mental Health [address] [place] [phone] or [phone]    Mobile Crisis Teams Organization         Address  Phone  Notes  [facility], Mobile Crisis Care Unit  [phone]   [facility]  [address] [place] [address] [place], [place] [phone]   [facility] [address] [place] [phone]    Self-Help/Support Groups Organization  Address  Phone             Notes  [facility] [place] - variety of support groups  336- I7437963 Call for more information  Narcotics Anonymous (NA),  [facility] [address], [employer] [place]  2 meetings at this location   Statistician         Address  Phone  Notes  [facility] [address],    [place]  [phone]   [facility]  [address], [place] [address], [place], [place] [phone]   [facility] [address] [place], [place] [phone] Admissions: 8am-3pm M-F  [facility] [address] [place] [address],    [place], [place] [phone]   [facility] [address] [place], [place], [place] [phone]   [facility] [address],  [place], [place] [phone]   [facility] - Intensive Outpatient [address]ure[address], [place], [place] [phone]   [facility] ([facility] [address] [place]  [place], [place] [phone] or [phone]   [facility] ([facility]) [address] [place], [place] [phone] Accepts Medicaid  [facility] [address]  [place] [phone] Substance Abuse/Addiction Treatment   [facility] Organization         Address  Phone  Notes  [facility]  [phone]   Angie Fava, PhD [address] Ervin Kn[place], [place]   [phone] or [phone]   [facility] Behavioral   [address] [place], [place] [phone]   [facility] [address], [place], [place] [phone] Insurance/Medicaid/sponsorship through [facility] Families [address]                                    [place], [place] [phone] Therapy/tele-psych/case  [facility] [address] [place] [address], [place] [phone]    Dr. Lolly Mustache  [phone]   [facility] [place]  [employer] [facility] Dept. 1) [address] [place] 2) [address] [place] 3)  [address], [place] [phone]  [phone]     [facility] Child Abuse Hotline [phone])375-1182 or 8187441887 (After Hours)

## 2015-04-15 NOTE — ED Notes (Signed)
Nicole, PA-C, at the bedside.  

## 2015-04-15 NOTE — Progress Notes (Signed)
Orthopedic Tech Progress Note Patient Details:  Devin Hudson October 06, 1986 [id number]  Ortho Devices Type of Ortho Device: Velcro wrist splint Ortho Device/Splint Location: RUE Ortho Device/Splint Interventions: Application   Asia R Thompson 04/15/2015, 6:46 AM

## 2015-04-15 NOTE — ED Provider Notes (Signed)
CSN: [id number]     Arrival date & time 04/14/15  2358 History   None    Chief Complaint  Patient presents with  . Fall     (Consider location/radiation/quality/duration/timing/severity/associated sxs/prior Treatment) HPI   Blood pressure 115/82, pulse 69, temperature 97.9 F (36.6 C), temperature source Oral, resp. rate 16, SpO2 100 %.  Devin Hudson is a 29 y.o. male complaining of 8/10 right hand pain centered on the MCP of the fourth and fifth digit status post F00SH 4 days ago. Pain is exacerbated by driving, no pain medication taken prior to arrival. In the slip and fall patient did not brace himself properly and fell down, he has mild abrasions on the right cheek and shoulder and right knee. Patient has been applying hydrogen peroxide but is concerned that there are infected. He denies head trauma, LOC, cervicalgia, chest pain, shortness of breath, abdominal pain, difficulty ambulating. Patient is right-hand-dominant.  Past Medical History  Diagnosis Date  . Liver damage   . Alcohol abuse    History reviewed. No pertinent past surgical history. No family history on file. History  Substance Use Topics  . Smoking status: Current Every Day Smoker -- 0.00 packs/day    Types: Cigarettes  . Smokeless tobacco: Never Used  . Alcohol Use: Yes    Review of Systems  10 systems reviewed and found to be negative, except as noted in the HPI.   Allergies  Other; Peanut butter flavor; and Peanut-containing drug products  Home Medications   Prior to Admission medications   Medication Sig Start Date End Date Taking? Authorizing Provider  famotidine (PEPCID) 20 MG tablet Take 1 tablet (20 mg total) by mouth 2 (two) times daily. Patient not taking: Reported on 12/08/2014 09/30/14   Antony Madura, PA-C  LORazepam (ATIVAN) 1 MG tablet Take 1 tablet (1 mg total) by mouth 3 (three) times daily as needed for anxiety (For anxiety and tremors). Patient not taking: Reported on 04/15/2015  09/30/14   Antony Madura, PA-C  sucralfate (CARAFATE) 1 GM/10ML suspension Take 10 mLs (1 g total) by mouth 4 (four) times daily -  with meals and at bedtime. Patient not taking: Reported on 12/08/2014 09/30/14   Antony Madura, PA-C  traZODone (DESYREL) 50 MG tablet Take 1 tablet (50 mg total) by mouth at bedtime and may repeat dose one time if needed. For sleep Patient not taking: Reported on 04/15/2015 06/12/14   Sanjuana Kava, NP   BP 115/82 mmHg  Pulse 69  Temp(Src) 97.9 F (36.6 C) (Oral)  Resp 16  SpO2 100% Physical Exam  Constitutional: He is oriented to person, place, and time. He appears well-developed and well-nourished. No distress.  HENT:  Head: Normocephalic and atraumatic.  Mouth/Throat: Oropharynx is clear and moist.  Eyes: Conjunctivae and EOM are normal. Pupils are equal, round, and reactive to light.  Neck: Normal range of motion.  Cardiovascular: Normal rate, regular rhythm and intact distal pulses.   Pulmonary/Chest: Effort normal and breath sounds normal. No respiratory distress. He has no wheezes. He has no rales. He exhibits no tenderness.  Abdominal: Soft. There is no tenderness.  Musculoskeletal: Normal range of motion. He exhibits tenderness.  No abrasions or laceration to right hand, excellent range of motion. Radial pulses 2+. No snuffbox tenderness palpation, mild tenderness palpation over the right fifth and MCP.  Right knee:  No deformity. FROM. No effusion or crepitance. Anterior and posterior drawer show no abnormal laxity. Stable to valgus and varus stress. Joint lines  are non-tender. Neurovascularly intact. Pt ambulates with non-antalgic gait.    Neurological: He is alert and oriented to person, place, and time.  Skin: He is not diaphoretic.  Partial thickness abrasion to right cheek, right shoulder and right knee. No warmth, discharge or surrounding cellulitis, minimally tender to palpation.  Psychiatric: He has a normal mood and affect.  Nursing note and  vitals reviewed.   ED Course  Procedures (including critical care time) Labs Review Labs Reviewed - No data to display  Imaging Review Dg Hand Complete Right  04/15/2015   CLINICAL DATA:  Fall 4 days ago. Pain at the third, fourth, and fifth MCP joints.  EXAM: RIGHT HAND - COMPLETE 3+ VIEW  COMPARISON:  None.  FINDINGS: Old appearing bone and soft tissue defect in the distal phalanx of the right fifth finger. No evidence of acute fracture or dislocation. Soft tissues are unremarkable.  IMPRESSION: No acute bony abnormalities.   Electronically Signed   By: Burman Nieves M.D.   On: 04/15/2015 02:47     EKG Interpretation None      MDM   Final diagnoses:  Hand contusion, right, initial encounter  Abrasions of multiple sites    Filed Vitals:   04/15/15 0012 04/15/15 0519 04/15/15 0600 04/15/15 0630  BP: 117/74 104/66 108/66 115/82  Pulse: 87 66 69 69  Temp: 98.4 F (36.9 C) 97.9 F (36.6 C)    TempSrc:  Oral    Resp: 16 16    SpO2: 97% 98% 95% 100%    Medications  ibuprofen (ADVIL,MOTRIN) tablet 800 mg (not administered)  bacitracin ointment 1 application (not administered)    Devin Hudson is a pleasant 29 y.o. male presenting with right hand pain status post fall on outstretched hand several days ago. No fracture, no snuffbox tenderness. Patient also has some partial-thickness abrasions which do not appear to be infected, he is applying hydroperoxide, we've had an extensive discussion of wound care. Patient reports a mild pain to the right knee, knee exam with no acute abnormality, don't think imaging is indicated at this time. Patient will be given wrist splint, encourage Motrin for pain control and bacitracin for abrasions.  Evaluation does not show pathology that would require ongoing emergent intervention or inpatient treatment. Pt is hemodynamically stable and mentating appropriately. Discussed findings and plan with patient/guardian, who agrees with care plan.  All questions answered. Return precautions discussed and outpatient follow up given.      Wynetta Emery, PA-C 04/15/15 0981  Marisa Severin, MD 04/15/15 [phone]

## 2015-04-15 NOTE — ED Notes (Signed)
Pt. slipped and fell last Sunday , reports abrasions at right shoulder , right knee and right hand pain with swelling . No LOC / ambulatory .

## 2016-01-18 IMAGING — CR DG HAND COMPLETE 3+V*R*
3 series · 3 of 3 positions shown · non-contrast
Comparison: None.

CLINICAL DATA: Fall 4 days ago. Pain at the third, fourth, and
fifth MCP joints.

EXAM:
RIGHT HAND - COMPLETE 3+ VIEW

[hand pa]
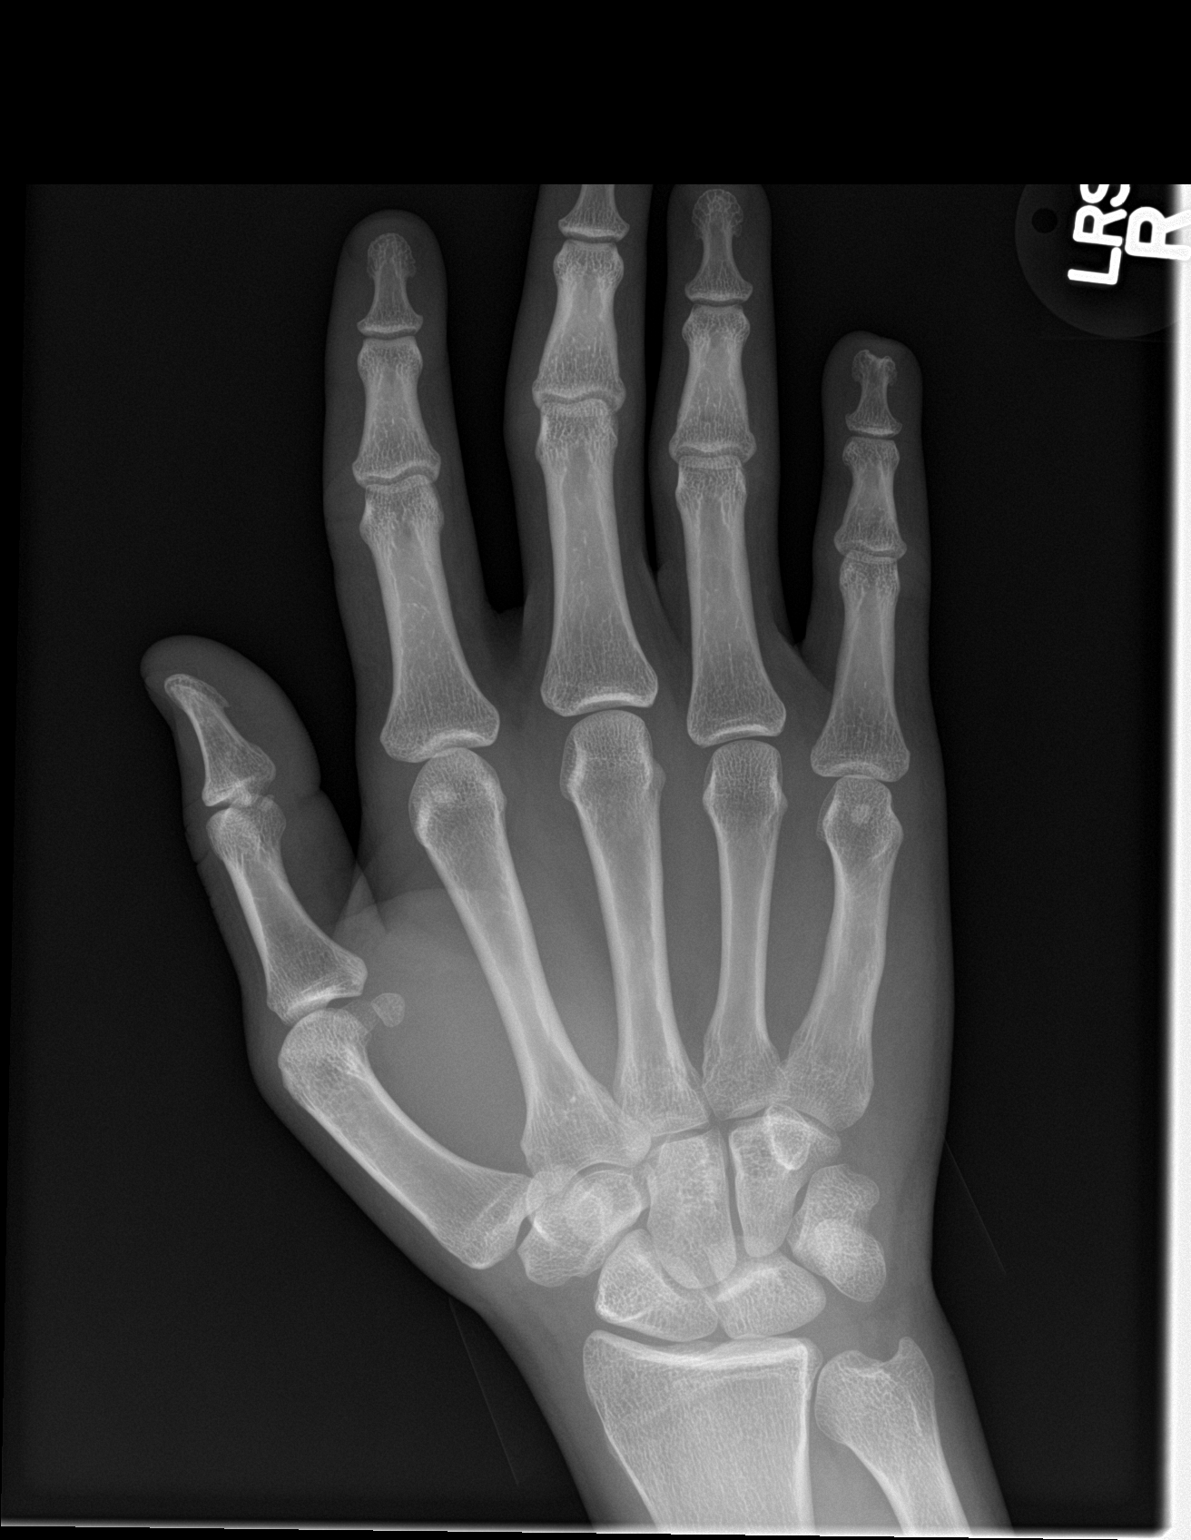

[hand obl]
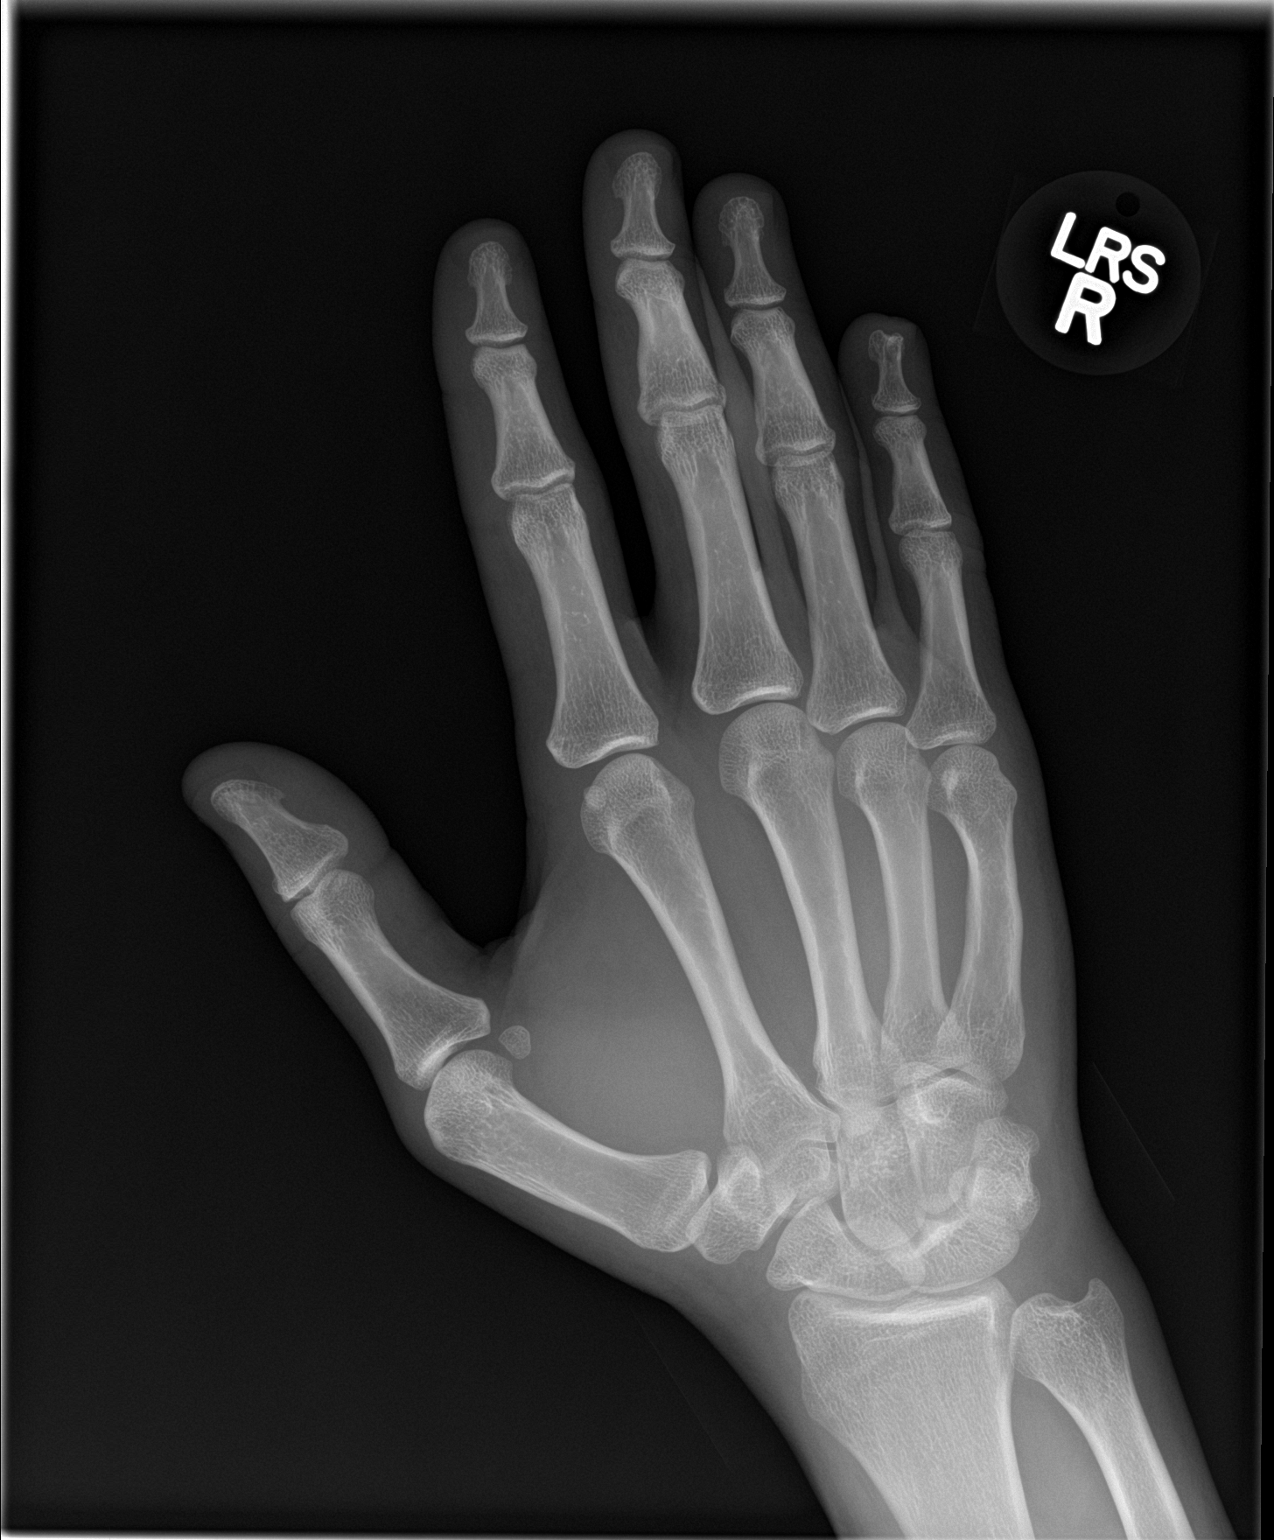

[hand lat]
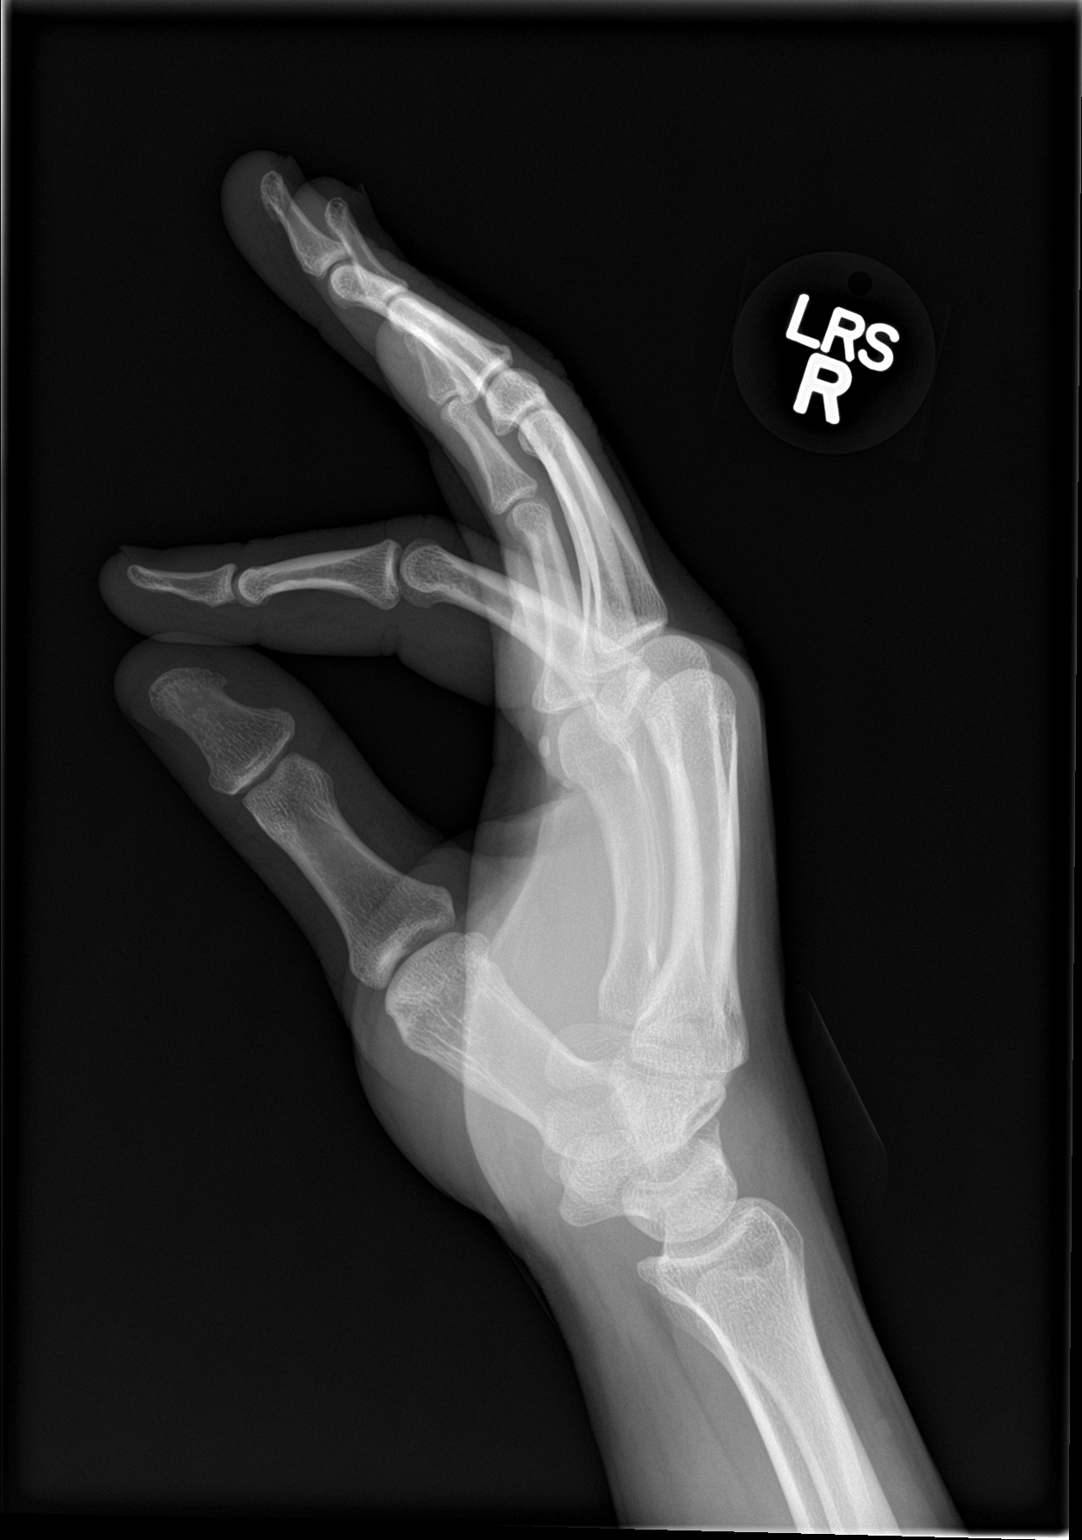

[3 of 3 positions shown; findings below may reference images not displayed]

FINDINGS: Old appearing bone and soft tissue defect in the distal phalanx of
the right fifth finger. No evidence of acute fracture or
dislocation. Soft tissues are unremarkable.
IMPRESSION: No acute bony abnormalities.

## 2016-02-03 ENCOUNTER — Encounter (HOSPITAL_COMMUNITY): Payer: Self-pay | Admitting: *Deleted

## 2016-02-03 ENCOUNTER — Emergency Department (HOSPITAL_COMMUNITY)
Admission: EM | Admit: 2016-02-03 | Discharge: 2016-02-03 | Disposition: A | Discharge status: Home / Self Care | Payer: BLUE CROSS/BLUE SHIELD | Attending: Emergency Medicine | Admitting: Emergency Medicine

## 2016-02-03 DIAGNOSIS — R61 Generalized hyperhidrosis: Secondary | ICD-10-CM | POA: Insufficient documentation

## 2016-02-03 DIAGNOSIS — F17210 Nicotine dependence, cigarettes, uncomplicated: Secondary | ICD-10-CM | POA: Diagnosis not present

## 2016-02-03 DIAGNOSIS — J988 Other specified respiratory disorders: Secondary | ICD-10-CM

## 2016-02-03 DIAGNOSIS — J069 Acute upper respiratory infection, unspecified: Secondary | ICD-10-CM | POA: Diagnosis not present

## 2016-02-03 DIAGNOSIS — M791 Myalgia: Secondary | ICD-10-CM | POA: Diagnosis present

## 2016-02-03 DIAGNOSIS — R630 Anorexia: Secondary | ICD-10-CM | POA: Diagnosis not present

## 2016-02-03 DIAGNOSIS — H9209 Otalgia, unspecified ear: Secondary | ICD-10-CM | POA: Diagnosis not present

## 2016-02-03 DIAGNOSIS — R42 Dizziness and giddiness: Secondary | ICD-10-CM | POA: Insufficient documentation

## 2016-02-03 DIAGNOSIS — H6122 Impacted cerumen, left ear: Secondary | ICD-10-CM | POA: Diagnosis not present

## 2016-02-03 DIAGNOSIS — Z8719 Personal history of other diseases of the digestive system: Secondary | ICD-10-CM | POA: Diagnosis not present

## 2016-02-03 DIAGNOSIS — B9789 Other viral agents as the cause of diseases classified elsewhere: Secondary | ICD-10-CM

## 2016-02-03 LAB — RAPID STREP SCREEN (MED CTR MEBANE ONLY): Streptococcus, Group A Screen (Direct): NEGATIVE

## 2016-02-03 MED ORDER — GUAIFENESIN 100 MG/5ML PO LIQD: 100.0000 mg | ORAL | Status: DC | PRN

## 2016-02-03 MED ORDER — CHLORHEXIDINE GLUCONATE 0.12 % MT SOLN: 15.0000 mL | Freq: Two times a day (BID) | OROMUCOSAL | Status: DC

## 2016-02-03 NOTE — Discharge Instructions (Signed)
Viral Infections °A viral infection can be caused by different types of viruses. Most viral infections are not serious and resolve on their own. However, some infections may cause severe symptoms and may lead to further complications. °SYMPTOMS °Viruses can frequently cause: °· Minor sore throat. °· Aches and pains. °· Headaches. °· Runny nose. °· Different types of rashes. °· Watery eyes. °· Tiredness. °· Cough. °· Loss of appetite. °· Gastrointestinal infections, resulting in nausea, vomiting, and diarrhea. °These symptoms do not respond to antibiotics because the infection is not caused by bacteria. However, you might catch a bacterial infection following the viral infection. This is sometimes called a "superinfection." Symptoms of such a bacterial infection may include: °· Worsening sore throat with pus and difficulty swallowing. °· Swollen neck glands. °· Chills and a high or persistent fever. °· Severe headache. °· Tenderness over the sinuses. °· Persistent overall ill feeling (malaise), muscle aches, and tiredness (fatigue). °· Persistent cough. °· Yellow, green, or brown mucus production with coughing. °HOME CARE INSTRUCTIONS  °· Only take over-the-counter or prescription medicines for pain, discomfort, diarrhea, or fever as directed by your caregiver. °· Drink enough water and fluids to keep your urine clear or pale yellow. Sports drinks can provide valuable electrolytes, sugars, and hydration. °· Get plenty of rest and maintain proper nutrition. Soups and broths with crackers or rice are fine. °SEEK IMMEDIATE MEDICAL CARE IF:  °· You have severe headaches, shortness of breath, chest pain, neck pain, or an unusual rash. °· You have uncontrolled vomiting, diarrhea, or you are unable to keep down fluids. °· You or your child has an oral temperature above 102° F (38.9° C), not controlled by medicine. °· Your baby is older than 3 months with a rectal temperature of 102° F (38.9° C) or higher. °· Your baby is 3  months old or younger with a rectal temperature of 100.4° F (38° C) or higher. °MAKE SURE YOU:  °· Understand these instructions. °· Will watch your condition. °· Will get help right away if you are not doing well or get worse. °  °This information is not intended to replace advice given to you by your health care provider. Make sure you discuss any questions you have with your health care provider. °  °Document Released: 07/26/2005 Document Revised: 01/08/2012 Document Reviewed: 03/24/2015 °Elsevier Interactive Patient Education ©2016 Elsevier Inc. ° °

## 2016-02-03 NOTE — ED Provider Notes (Signed)
CSN: [id number]     Arrival date & time 02/03/16  [id number] History  By signing my name below, I, Ronney LionSuzanne Le, attest that this documentation has been prepared under the direction and in the presence of Fayrene HelperBowie Jakai Onofre, PA-C. Electronically Signed: Ronney LionSuzanne Le, ED Scribe. 02/03/2016. 9:30 AM.    Chief Complaint  Patient presents with  . Generalized Body Aches   The history is provided by the patient. No language interpreter was used.    HPI Comments: Devin Hudson is a 30 y.o. male with a history of liver damage and alcohol abuse, who presents to the Emergency Department complaining of gradual-onset, constant, generalized myalgia that began days agos. Associated symptoms include headache, sore throat, cough, otalgia, generalized weakness, subjective, fever, "cold sweats," and an episode of light-headedness that occurred yesterday while at work. He notes a cough but states his sore throat is worse. He reports possible sick contact at work. Patient states he had taken Robitussin and Mucinex with no relief. He states he smokes 1-2 cigarettes a day. Patient states he is unsure of any other chronic medical conditions, as he has not seen a PCP in a while (no PCP established). He denies SOB, difficulty urinating, dysuria, or congestion.  He has NKDA.  Past Medical History  Diagnosis Date  . Liver damage   . Alcohol abuse    Past Surgical History  Procedure Laterality Date  . Finger arthroplasty Right   . Tonsillectomy     No family history on file. Social History  Substance Use Topics  . Smoking status: Current Every Day Smoker -- 0.20 packs/day    Types: Cigarettes  . Smokeless tobacco: Never Used  . Alcohol Use: No    Review of Systems  Constitutional: Positive for fever, chills, diaphoresis and appetite change.  HENT: Positive for sore throat. Negative for congestion.   Respiratory: Positive for cough. Negative for shortness of breath.   Genitourinary: Negative for dysuria and difficulty  urinating.  Neurological: Positive for weakness (generalized), light-headedness and headaches.      Allergies  Other; Peanut butter flavor; and Peanut-containing drug products  Home Medications   Prior to Admission medications   Medication Sig Start Date End Date Taking? Authorizing Provider  famotidine (PEPCID) 20 MG tablet Take 1 tablet (20 mg total) by mouth 2 (two) times daily. Patient not taking: Reported on 12/08/2014 09/30/14   Antony MaduraKelly Humes, PA-C  LORazepam (ATIVAN) 1 MG tablet Take 1 tablet (1 mg total) by mouth 3 (three) times daily as needed for anxiety (For anxiety and tremors). Patient not taking: Reported on 04/15/2015 09/30/14   Antony MaduraKelly Humes, PA-C  sucralfate (CARAFATE) 1 GM/10ML suspension Take 10 mLs (1 g total) by mouth 4 (four) times daily -  with meals and at bedtime. Patient not taking: Reported on 12/08/2014 09/30/14   Antony MaduraKelly Humes, PA-C  traZODone (DESYREL) 50 MG tablet Take 1 tablet (50 mg total) by mouth at bedtime and may repeat dose one time if needed. For sleep Patient not taking: Reported on 04/15/2015 06/12/14   Sanjuana KavaAgnes I Nwoko, NP   BP 117/79 mmHg  Pulse 98  Temp(Src) 98.9 F (37.2 C) (Oral)  Resp 16  Ht 5\' 9"  (1.753 m)  Wt 136 lb 1 oz (61.718 kg)  BMI 20.08 kg/m2  SpO2 96% Physical Exam  Constitutional: He is oriented to person, place, and time. He appears well-developed and well-nourished. No distress.  HENT:  Head: Normocephalic and atraumatic.  Right Ear: Tympanic membrane normal.  Nose: Rhinorrhea present.  Mouth/Throat: Uvula is midline. No oropharyngeal exudate.  Right TM is normal. Left TM is obstructed due to cerumen impaction. Mild rhinorrhea with trace of blood noted in right nare. Uvula is midline. No tonsillar enlargement or exudate. No trismus.  Eyes: Conjunctivae and EOM are normal.  Neck: Normal range of motion. Neck supple. No tracheal deviation present.  Trachea is midline. No cervical lymphadenopathy. No nuchal rigidity.  Cardiovascular:  Normal rate, regular rhythm and normal heart sounds.   Pulmonary/Chest: Effort normal and breath sounds normal. No respiratory distress. He has no wheezes. He has no rales.  Musculoskeletal: Normal range of motion.  Lymphadenopathy:    He has no cervical adenopathy.  Neurological: He is alert and oriented to person, place, and time.  Skin: Skin is warm and dry.  Psychiatric: He has a normal mood and affect. His behavior is normal.  Nursing note and vitals reviewed.   ED Course  Procedures (including critical care time)  DIAGNOSTIC STUDIES: Oxygen Saturation is 96% on RA, normal by my interpretation.    COORDINATION OF CARE: 9:29 AM - Suspect viral influenza. Offered to write prescription for Tamiflu; discussed side effects. Advised pt his symptoms can last for 1-2 weeks. Will give work note and referral to PCP to establish primary care. Pt verbalized understanding and agreed to plan.   Labs Review Labs Reviewed  RAPID STREP SCREEN (NOT AT [facility])  CULTURE, GROUP A STREP [employer])   MDM   Final diagnoses:  None   Patients symptoms are consistent with URI, likely viral etiology. Discussed that antibiotics are not indicated for viral infections. Pt will be discharged with symptomatic treatment (Robitussin, Perdex solution)  Verbalizes understanding and is agreeable with plan. Pt is hemodynamically stable & in NAD prior to dc.  BP 117/79 mmHg  Pulse 98  Temp(Src) 98.9 F (37.2 C) (Oral)  Resp 16  Ht  (1.753 m)  Wt 61.718 kg  BMI 20.08 kg/m2  SpO2 96%   I personally performed the services described in this documentation, which was scribed in my presence. The recorded information has been reviewed and is accurate.       Fayrene Helper, PA-C 02/03/16 [id number]  Benjiman Core, MD 02/04/16 [phone]

## 2016-02-03 NOTE — ED Notes (Signed)
Pt c/o generalized body aches, fever, & sore throat x 2 days, pt denies cough, A&O x4

## 2016-02-03 NOTE — ED Notes (Signed)
C/o fever, headaches, bodyaches,weakness x 2 days.

## 2016-02-05 LAB — CULTURE, GROUP A STREP (THRC)

## 2020-01-02 ENCOUNTER — Emergency Department (HOSPITAL_COMMUNITY)
Admission: EM | Admit: 2020-01-02 | Discharge: 2020-01-02 | Disposition: A | Discharge status: Home / Self Care | Payer: No Typology Code available for payment source | Attending: Emergency Medicine | Admitting: Emergency Medicine

## 2020-01-02 ENCOUNTER — Encounter (HOSPITAL_COMMUNITY): Payer: Self-pay | Admitting: Emergency Medicine

## 2020-01-02 ENCOUNTER — Other Ambulatory Visit: Payer: Self-pay

## 2020-01-02 ENCOUNTER — Emergency Department (HOSPITAL_COMMUNITY): Payer: No Typology Code available for payment source

## 2020-01-02 DIAGNOSIS — Z91010 Allergy to peanuts: Secondary | ICD-10-CM | POA: Diagnosis not present

## 2020-01-02 DIAGNOSIS — Y939 Activity, unspecified: Secondary | ICD-10-CM | POA: Diagnosis not present

## 2020-01-02 DIAGNOSIS — Y999 Unspecified external cause status: Secondary | ICD-10-CM | POA: Insufficient documentation

## 2020-01-02 DIAGNOSIS — M25522 Pain in left elbow: Secondary | ICD-10-CM | POA: Diagnosis present

## 2020-01-02 DIAGNOSIS — Y929 Unspecified place or not applicable: Secondary | ICD-10-CM | POA: Insufficient documentation

## 2020-01-02 DIAGNOSIS — F17210 Nicotine dependence, cigarettes, uncomplicated: Secondary | ICD-10-CM | POA: Insufficient documentation

## 2020-01-02 DIAGNOSIS — S5002XA Contusion of left elbow, initial encounter: Secondary | ICD-10-CM | POA: Insufficient documentation

## 2020-01-02 NOTE — Discharge Instructions (Addendum)
Rest ice and elevate your elbow.  I recommend using Ace wrap for compression as well.  He can use Tylenol and ibuprofen for pain.  Please use Tylenol or ibuprofen for pain.  You may use 600 mg ibuprofen every 6 hours or 1000 mg of Tylenol every 6 hours.  You may choose to alternate between the 2.  This would be most effective.  Not to exceed 4 g of Tylenol within 24 hours.  Not to exceed 3200 mg ibuprofen 24 hours.
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
## 2020-02-14 ENCOUNTER — Emergency Department (HOSPITAL_COMMUNITY)
Admission: EM | Admit: 2020-02-14 | Discharge: 2020-02-14 | Disposition: A | Discharge status: Home / Self Care | Payer: Medicaid Other | Attending: Emergency Medicine | Admitting: Emergency Medicine

## 2020-02-14 ENCOUNTER — Encounter (HOSPITAL_COMMUNITY): Payer: Self-pay | Admitting: Emergency Medicine

## 2020-02-14 ENCOUNTER — Other Ambulatory Visit: Payer: Self-pay

## 2020-02-14 DIAGNOSIS — Z5321 Procedure and treatment not carried out due to patient leaving prior to being seen by health care provider: Secondary | ICD-10-CM | POA: Diagnosis not present

## 2020-02-14 DIAGNOSIS — R1110 Vomiting, unspecified: Secondary | ICD-10-CM | POA: Insufficient documentation

## 2020-02-14 NOTE — ED Triage Notes (Signed)
Pt states he is going to ETOH withdraw, he had the last ETOH tonight, pt c/o nausea, vomiting and diarrhea.

## 2020-02-14 NOTE — ED Notes (Signed)
No answer to vitals x1

## 2020-02-19 ENCOUNTER — Encounter (HOSPITAL_COMMUNITY): Payer: Self-pay | Admitting: Emergency Medicine

## 2020-02-19 ENCOUNTER — Emergency Department (HOSPITAL_COMMUNITY)
Admission: EM | Admit: 2020-02-19 | Discharge: 2020-02-19 | Disposition: A | Discharge status: Home / Self Care | Payer: Medicaid Other | Attending: Emergency Medicine | Admitting: Emergency Medicine

## 2020-02-19 DIAGNOSIS — Z79899 Other long term (current) drug therapy: Secondary | ICD-10-CM | POA: Diagnosis not present

## 2020-02-19 DIAGNOSIS — F17210 Nicotine dependence, cigarettes, uncomplicated: Secondary | ICD-10-CM | POA: Insufficient documentation

## 2020-02-19 DIAGNOSIS — F1010 Alcohol abuse, uncomplicated: Secondary | ICD-10-CM

## 2020-02-19 DIAGNOSIS — Y908 Blood alcohol level of 240 mg/100 ml or more: Secondary | ICD-10-CM | POA: Insufficient documentation

## 2020-02-19 DIAGNOSIS — F10920 Alcohol use, unspecified with intoxication, uncomplicated: Secondary | ICD-10-CM | POA: Insufficient documentation

## 2020-02-19 DIAGNOSIS — Z91010 Allergy to peanuts: Secondary | ICD-10-CM | POA: Insufficient documentation

## 2020-02-19 DIAGNOSIS — R4182 Altered mental status, unspecified: Secondary | ICD-10-CM | POA: Diagnosis present

## 2020-02-19 DIAGNOSIS — Z96691 Finger-joint replacement of right hand: Secondary | ICD-10-CM | POA: Diagnosis not present

## 2020-02-19 LAB — COMPREHENSIVE METABOLIC PANEL
ALT: 114 U/L — ABNORMAL HIGH (ref 0–44)
AST: 131 U/L — ABNORMAL HIGH (ref 15–41)
Albumin: 4.2 g/dL (ref 3.5–5.0)
Alkaline Phosphatase: 94 U/L (ref 38–126)
Anion gap: 12 (ref 5–15)
BUN: 11 mg/dL (ref 6–20)
CO2: 26 mmol/L (ref 22–32)
Calcium: 8.5 mg/dL — ABNORMAL LOW (ref 8.9–10.3)
Chloride: 103 mmol/L (ref 98–111)
Creatinine, Ser: 0.78 mg/dL (ref 0.61–1.24)
GFR calc Af Amer: >60 mL/min (ref >60)
GFR calc non Af Amer: >60 mL/min (ref >60)
Glucose, Bld: 101 mg/dL — ABNORMAL HIGH (ref 70–99)
Potassium: 4.1 mmol/L (ref 3.5–5.1)
Sodium: 141 mmol/L (ref 135–145)
Total Bilirubin: 1.5 mg/dL — ABNORMAL HIGH (ref 0.3–1.2)
Total Protein: 7.9 g/dL (ref 6.5–8.1)

## 2020-02-19 LAB — RAPID URINE DRUG SCREEN, HOSP PERFORMED
Amphetamines: NOT DETECTED
Barbiturates: NOT DETECTED
Benzodiazepines: NOT DETECTED
Cocaine: NOT DETECTED
Opiates: NOT DETECTED
Tetrahydrocannabinol: POSITIVE — AB

## 2020-02-19 LAB — CBC WITH DIFFERENTIAL/PLATELET
Abs Immature Granulocytes: 0 10*3/uL (ref 0.00–0.07)
Basophils Absolute: 0.1 10*3/uL (ref 0.0–0.1)
Basophils Relative: 1 %
Eosinophils Absolute: 0 10*3/uL (ref 0.0–0.5)
Eosinophils Relative: 0 %
HCT: 47.8 % (ref 39.0–52.0)
Hemoglobin: 16.8 g/dL (ref 13.0–17.0)
Immature Granulocytes: 0 %
Lymphocytes Relative: 36 %
Lymphs Abs: 1.8 10*3/uL (ref 0.7–4.0)
MCH: 33.3 pg (ref 26.0–34.0)
MCHC: 35.1 g/dL (ref 30.0–36.0)
MCV: 94.7 fL (ref 80.0–100.0)
Monocytes Absolute: 0.4 10*3/uL (ref 0.1–1.0)
Monocytes Relative: 7 %
Neutro Abs: 2.7 10*3/uL (ref 1.7–7.7)
Neutrophils Relative %: 56 %
Platelets: 139 10*3/uL — ABNORMAL LOW (ref 150–400)
RBC: 5.05 MIL/uL (ref 4.22–5.81)
RDW: 13.6 % (ref 11.5–15.5)
WBC: 4.8 10*3/uL (ref 4.0–10.5)
nRBC: 0 % (ref 0.0–0.2)

## 2020-02-19 LAB — URINALYSIS, ROUTINE W REFLEX MICROSCOPIC
Bacteria, UA: NONE SEEN
Bilirubin Urine: NEGATIVE
Glucose, UA: NEGATIVE mg/dL
Hgb urine dipstick: NEGATIVE
Ketones, ur: 20 mg/dL — AB
Leukocytes,Ua: NEGATIVE
Nitrite: NEGATIVE
Protein, ur: 100 mg/dL — AB
Specific Gravity, Urine: 1.027 (ref 1.005–1.030)
pH: 6 (ref 5.0–8.0)

## 2020-02-19 LAB — LIPASE, BLOOD: Lipase: 41 U/L (ref 11–51)

## 2020-02-19 LAB — ACETAMINOPHEN LEVEL: Acetaminophen (Tylenol), Serum: 10 ug/mL (ref 10–30)

## 2020-02-19 LAB — ETHANOL: Alcohol, Ethyl (B): 420 mg/dL (ref <10)

## 2020-02-19 LAB — PROTIME-INR
INR: 1 (ref 0.8–1.2)
Prothrombin Time: 12.8 seconds (ref 11.4–15.2)

## 2020-02-19 MED ORDER — THIAMINE HCL 100 MG/ML IJ SOLN
Freq: Once | INTRAVENOUS | Status: AC
  Filled 2020-02-19: qty 1000
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
## 2020-02-19 NOTE — ED Notes (Signed)
Date and time results received: 02/19/20 5:54 PM   Test: Alcohol Critical Value: 420  Name of Provider Notified: Dr Donnald Garre

## 2020-02-19 NOTE — ED Notes (Signed)
Patient given ice water 

## 2020-02-19 NOTE — ED Notes (Signed)
Pt ambulated to bathroom wo any assistance

## 2020-02-19 NOTE — Discharge Instructions (Addendum)
Alcohol Abuse and Dependence Information, Adult Alcohol is a widely available drug. People drink alcohol in different amounts. People who drink alcohol very often and in large amounts often have problems during and after drinking. They may develop what is called an alcohol use disorder. There are two main types of alcohol use disorders:  Alcohol abuse. This is when you use alcohol too much or too often. You may use alcohol to make yourself feel happy or to reduce stress. You may have a hard time setting a limit on the amount you drink.  Alcohol dependence. This is when you use alcohol consistently for a period of time, and your body changes as a result. This can make it hard to stop drinking because you may start to feel sick or feel different when you do not use alcohol. These symptoms are known as withdrawal. How can alcohol abuse and dependence affect me? Alcohol abuse and dependence can have a negative effect on your life. Drinking too much can lead to addiction. You may feel like you need alcohol to function normally. You may drink alcohol before work in the morning, during the day, or as soon as you get home from work in the evening. These actions can result in:  Poor work performance.  Job loss.  Financial problems.  Car crashes or criminal charges from driving after drinking alcohol.  Problems in your relationships with friends and family.  Losing the trust and respect of coworkers, friends, and family. Drinking heavily over a long period of time can permanently damage your body and brain, and can cause lifelong health issues, such as:  Damage to your liver or pancreas.  Heart problems, high blood pressure, or stroke.  Certain cancers.  Decreased ability to fight infections.  Brain or nerve damage.  Depression.  Early (premature) death. If you are careless or you crave alcohol, it is easy to drink more than your body can handle (overdose). Alcohol overdose is a serious  situation that requires hospitalization. It may lead to permanent injuries or death. What can increase my risk?  Having a family history of alcohol abuse.  Having depression or other mental health conditions.  Beginning to drink at an early age.  Binge drinking often.  Experiencing trauma, stress, and an unstable home life during childhood.  Spending time with people who drink often. What actions can I take to prevent or manage alcohol abuse and dependence?  Do not drink alcohol if: ? Your health care provider tells you not to drink. ? You are pregnant, may be pregnant, or are planning to become pregnant.  If you drink alcohol: ? Limit how much you use to:  0-1 drink a day for women.  0-2 drinks a day for men. ? Be aware of how much alcohol is in your drink. In the [place] one drink equals one 12 oz bottle of beer (355 mL), one 5 oz glass of wine (148 mL), or one 1 oz glass of hard liquor (44 mL).  Stop drinking if you have been drinking too much. This can be very hard to do if you are used to abusing alcohol. If you begin to have withdrawal symptoms, talk with your health care provider or a person that you trust. These symptoms may include anxiety, shaky hands, headache, nausea, sweating, or not being able to sleep.  Choose to drink nonalcoholic beverages in social gatherings and places where there may be alcohol. Activity  Spend more time on activities that you enjoy that  do not involve alcohol, like hobbies or exercise.  Find healthy ways to cope with stress, such as exercise, meditation, or spending time with people you care about. General information  Talk to your family, coworkers, and friends about supporting you in your efforts to stop drinking. If they drink, ask them not to drink around you. Spend more time with people who do not drink alcohol.  If you think that you have an alcohol dependency problem: ? Tell friends or family about your concerns. ? Talk with your  health care provider or another health professional about where to get help. ? Work with a Paramedic and a Network engineer. ? Consider joining a support group for people who struggle with alcohol abuse and dependence. Where to find support   Your health care provider.  SMART Recovery: www.smartrecovery.org Therapy and support groups  Local treatment centers or chemical dependency counselors.  Local AA groups in your community: SalaryStart.tn Where to find more information  Centers for Disease Control and Prevention: FootballExhibition.com.br  [employer] on Alcohol Abuse and Alcoholism: BasicStudents.dk  Alcoholics Anonymous (AA): SalaryStart.tn Contact a health care provider if:  You drank more or for longer than you intended on more than one occasion.  You tried to stop drinking or to cut back on how much you drink, but you were not able to.  You often drink to the point of vomiting or passing out.  You want to drink so badly that you cannot think about anything else.  You have problems in your life due to drinking, but you continue to drink.  You keep drinking even though you feel anxious, depressed, or have experienced memory loss.  You have stopped doing the things you used to enjoy in order to drink.  You have to drink more than you used to in order to get the effect you want.  You experience anxiety, sweating, nausea, shakiness, and trouble sleeping when you try to stop drinking. Get help right away if:  You have thoughts about hurting yourself or others.  You have serious withdrawal symptoms, including: ? Confusion. ? Racing heart. ? High blood pressure. ? Fever. If you ever feel like you may hurt yourself or others, or have thoughts about taking your own life, get help right away. You can go to your nearest emergency department or call:  Your local emergency services (911 in the [place]  A suicide crisis helpline, such as the National Suicide Prevention  Lifeline at 331-319-4164. This is open 24 hours a day. Summary  Alcohol abuse and dependence can have a negative effect on your life. Drinking too much or too often can lead to addiction.  If you drink alcohol, limit how much you use.  If you are having trouble keeping your drinking under control, find ways to change your behavior. Hobbies, calming activities, exercise, or support groups can help.  If you feel you need help with changing your drinking habits, talk with your health care provider, a good friend, or a therapist, or go to an AA group. This information is not intended to replace advice given to you by your health care provider. Make sure you discuss any questions you have with your health care provider. Document Revised: 02/04/2019 Document Reviewed: 12/24/2018 Elsevier Patient Education  2020 [employer]. 1.  Use the resource guide to find a treatment program. 2.  Your alcohol consumption is resulting in problems with your liver.  The liver function numbers are slowly increasing.  As you continue to  drink you risk developing full liver failure.  You must get treatment. 3.  Get established with a family doctor who can help you with medical care.  There is a referral number in your discharge instructions.  Try to get an appointment within the next 7 to 10 days.

## 2020-02-19 NOTE — ED Provider Notes (Signed)
Treatment [facility]-EMERGENCY DEPT Provider Note   CSN: [id number] Arrival date & time: 02/19/20  1331     History Chief Complaint  Patient presents with  . Alcohol Intoxication    Keen Ewalt is a 34 y.o. male.  HPI Patient reports he is here to quit drinking.  Reports he has had heavy alcohol consumption for a number of months.  He reports last alcohol was yesterday.  He reports he is experiencing some withdrawal symptoms.  He has been having nausea and vomiting.  He denies any history of seizures with withdrawal.  He reports his mother came over to his place this morning and found him and called the ambulance.  He denies that he wants to go into a treatment program.  He denies any thoughts of self-harm or injury.  He reports he did have a long period of being sober and wishes to accomplish that again.  He relapsed a number of months ago.    Past Medical History:  Diagnosis Date  . Alcohol abuse   . Liver damage     Patient Active Problem List   Diagnosis Date Noted  . Alcohol dependence (HCC) 06/11/2014  . PTSD (post-traumatic stress disorder) 06/11/2014  . Substance induced mood disorder (HCC) 06/11/2014  . No significant medical problems     Past Surgical History:  Procedure Laterality Date  . FINGER ARTHROPLASTY Right   . TONSILLECTOMY         No family history on file.  Social History   Tobacco Use  . Smoking status: Current Every Day Smoker    Packs/day: 0.20    Types: Cigarettes  . Smokeless tobacco: Never Used  Substance Use Topics  . Alcohol use: No  . Drug use: No    Types: Marijuana    Home Medications Prior to Admission medications   Medication Sig Start Date End Date Taking? Authorizing Provider  chlorhexidine (PERIDEX) 0.12 % solution Use as directed 15 mLs in the mouth or throat 2 (two) times daily. 02/03/16   Fayrene Helper, PA-C  famotidine (PEPCID) 20 MG tablet Take 1 tablet (20 mg total) by mouth 2 (two) times  daily. Patient not taking: Reported on 12/08/2014 09/30/14   Antony Madura, PA-C  guaiFENesin (ROBITUSSIN) 100 MG/5ML liquid Take 5-10 mLs (100-200 mg total) by mouth every 4 (four) hours as needed for congestion. 02/03/16   Fayrene Helper, PA-C  LORazepam (ATIVAN) 1 MG tablet Take 1 tablet (1 mg total) by mouth 3 (three) times daily as needed for anxiety (For anxiety and tremors). Patient not taking: Reported on 04/15/2015 09/30/14   Antony Madura, PA-C  sucralfate (CARAFATE) 1 GM/10ML suspension Take 10 mLs (1 g total) by mouth 4 (four) times daily -  with meals and at bedtime. Patient not taking: Reported on 12/08/2014 09/30/14   Antony Madura, PA-C  traZODone (DESYREL) 50 MG tablet Take 1 tablet (50 mg total) by mouth at bedtime and may repeat dose one time if needed. For sleep Patient not taking: Reported on 04/15/2015 06/12/14   Armandina Stammer I, NP    Allergies    Other, Peanut butter flavor, and Peanut-containing drug products  Review of Systems   Review of Systems 10 Systems reviewed and are negative for acute change except as noted in the HPI.  Physical Exam Updated Vital Signs BP (!) 154/109 (BP Location: Left Arm)   Pulse (!) 112   Temp 98.6 F (37 C) (Oral)   Resp 18   SpO2 100%  Physical Exam Constitutional:      Comments: Patient resting quietly in the stretcher.  No respiratory distress.  No agitation.  Well-nourished well-developed.  HENT:     Head: Normocephalic and atraumatic.     Mouth/Throat:     Mouth: Mucous membranes are moist.     Pharynx: Oropharynx is clear.  Eyes:     Comments: Extraocular motions intact.  Patient does have subtle lateral nystagmus bilaterally and symmetric.  Diffuse mild scleral injection.  Cardiovascular:     Comments: Borderline tachycardia.  No rub murmur gallop. Pulmonary:     Effort: Pulmonary effort is normal.     Breath sounds: Normal breath sounds.  Abdominal:     General: There is no distension.     Palpations: Abdomen is soft.      Tenderness: There is no abdominal tenderness. There is no guarding.  Musculoskeletal:        General: No swelling or tenderness. Normal range of motion.     Cervical back: Neck supple.     Right lower leg: No edema.     Left lower leg: No edema.     Comments: Extremities well formed and well developed.  Normal musculature.  No peripheral edema.  Skin:    General: Skin is warm and dry.  Neurological:     General: No focal deficit present.     Mental Status: He is oriented to person, place, and time.     Cranial Nerves: No cranial nerve deficit.     Coordination: Coordination normal.  Psychiatric:        Mood and Affect: Mood normal.     ED Results / Procedures / Treatments   Labs (all labs ordered are listed, but only abnormal results are displayed) Labs Reviewed - No data to display  EKG None  Radiology No results found.  Procedures Procedures (including critical care time)  Medications Ordered in ED Medications - No data to display  ED Course  I have reviewed the triage vital signs and the nursing notes.  Pertinent labs & imaging results that were available during my care of the patient were reviewed by me and considered in my medical decision making (see chart for details).    MDM Rules/Calculators/A&P                      Patient has been resting quietly.  He does awaken to light stimulus.  He advised that he was in alcohol withdrawal.  Alcohol is greater than 400.  At this time, patient is acutely intoxicated.  Will rehydrate with fluids with multivitamin, folate and thiamine.  Will reassess.  At the time of initial interview patient reports he did not want to get any counseling or be in  a formal treatment program but he wanted to "get straight".  Patient has become alert and oriented.  He has eaten and has something to drink.  He is ambulating and wishes to leave.  He reports he still does not want to seek a treatment program immediately.  He advises he just wants  to go home and rest a while and then he will start working on finding treatment.  Patient's appearance is good.  He is alert and nontoxic.  Mental status is clear.  Patient has risk for permanent liver damage and potential self injury due to alcohol abuse.  He has been counseled on risks and need for imminent treatment.  He voices understanding. Final Clinical Impression(s) / ED Diagnoses  Final diagnoses:  Alcohol abuse  Acute alcoholic intoxication without complication [facility])    Rx / DC Orders ED Discharge Orders    None       Arby Barrette, MD 02/19/20 [phone]

## 2020-02-19 NOTE — ED Notes (Signed)
[name], mom, would like to speak to the physician about getting her son IVC'D [phone].
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
## 2021-08-09 ENCOUNTER — Other Ambulatory Visit: Payer: Self-pay

## 2021-08-09 ENCOUNTER — Emergency Department (HOSPITAL_BASED_OUTPATIENT_CLINIC_OR_DEPARTMENT_OTHER)
Admission: EM | Admit: 2021-08-09 | Discharge: 2021-08-09 | Disposition: A | Discharge status: Home / Self Care | Payer: Medicaid Other | Attending: Emergency Medicine | Admitting: Emergency Medicine

## 2021-08-09 DIAGNOSIS — F17210 Nicotine dependence, cigarettes, uncomplicated: Secondary | ICD-10-CM | POA: Insufficient documentation

## 2021-08-09 DIAGNOSIS — F10129 Alcohol abuse with intoxication, unspecified: Secondary | ICD-10-CM | POA: Insufficient documentation

## 2021-08-09 DIAGNOSIS — Z91010 Allergy to peanuts: Secondary | ICD-10-CM | POA: Diagnosis not present

## 2021-08-09 DIAGNOSIS — F10920 Alcohol use, unspecified with intoxication, uncomplicated: Secondary | ICD-10-CM

## 2021-08-09 NOTE — ED Provider Notes (Signed)
[facility] EMERGENCY DEPARTMENT Provider Note   CSN: [id number] Arrival date & time: 08/09/21  1126     History Chief Complaint  Patient presents with   Alcohol Intoxication    Devin Hudson is a 35 y.o. male.  35 yo M with a chief complaint of alcohol intoxication.  Patient is here with a friend who feels like he needs help from drinking alcohol.  Has had symptoms with stopping drinking before but is never had seizures or hallucinations.  Patient does endorse drinking fairly heavily on the way here.  He is worried because his hands are cold.  The patient's friend feels like he needs IV fluids and medications to help him with his symptoms.  The history is provided by the patient and a friend.  Alcohol Intoxication This is a recurrent problem. The current episode started less than 1 hour ago. The problem occurs constantly. The problem has not changed since onset.Pertinent negatives include no chest pain, no abdominal pain, no headaches and no shortness of breath. Nothing aggravates the symptoms. Nothing relieves the symptoms. He has tried nothing for the symptoms. The treatment provided no relief.      Past Medical History:  Diagnosis Date   Alcohol abuse    Liver damage     Patient Active Problem List   Diagnosis Date Noted   Alcohol dependence (HCC) 06/11/2014   PTSD (post-traumatic stress disorder) 06/11/2014   Substance induced mood disorder (HCC) 06/11/2014   No significant medical problems     Past Surgical History:  Procedure Laterality Date   FINGER ARTHROPLASTY Right    TONSILLECTOMY         No family history on file.  Social History   Tobacco Use   Smoking status: Every Day    Packs/day: 0.20    Types: Cigarettes   Smokeless tobacco: Never  Substance Use Topics   Alcohol use: No   Drug use: No    Types: Marijuana    Home Medications Prior to Admission medications   Medication Sig Start Date End Date Taking? Authorizing Provider   chlorhexidine (PERIDEX) 0.12 % solution Use as directed 15 mLs in the mouth or throat 2 (two) times daily. 02/03/16   Fayrene Helper, PA-C  famotidine (PEPCID) 20 MG tablet Take 1 tablet (20 mg total) by mouth 2 (two) times daily. Patient not taking: Reported on 12/08/2014 09/30/14   Antony Madura, PA-C  guaiFENesin (ROBITUSSIN) 100 MG/5ML liquid Take 5-10 mLs (100-200 mg total) by mouth every 4 (four) hours as needed for congestion. 02/03/16   Fayrene Helper, PA-C  LORazepam (ATIVAN) 1 MG tablet Take 1 tablet (1 mg total) by mouth 3 (three) times daily as needed for anxiety (For anxiety and tremors). Patient not taking: Reported on 04/15/2015 09/30/14   Antony Madura, PA-C  sucralfate (CARAFATE) 1 GM/10ML suspension Take 10 mLs (1 g total) by mouth 4 (four) times daily -  with meals and at bedtime. Patient not taking: Reported on 12/08/2014 09/30/14   Antony Madura, PA-C  traZODone (DESYREL) 50 MG tablet Take 1 tablet (50 mg total) by mouth at bedtime and may repeat dose one time if needed. For sleep Patient not taking: Reported on 04/15/2015 06/12/14   Armandina Stammer I, NP    Allergies    Other, Peanut butter flavor, and Peanut-containing drug products  Review of Systems   Review of Systems  Constitutional:  Negative for chills and fever.  HENT:  Negative for congestion and facial swelling.   Eyes:  Negative for discharge and visual disturbance.  Respiratory:  Negative for shortness of breath.   Cardiovascular:  Negative for chest pain and palpitations.  Gastrointestinal:  Negative for abdominal pain, diarrhea and vomiting.  Musculoskeletal:  Negative for arthralgias and myalgias.  Skin:  Negative for color change and rash.  Neurological:  Negative for tremors, syncope and headaches.  Psychiatric/Behavioral:  Negative for confusion and dysphoric mood.    Physical Exam Updated Vital Signs BP (!) 126/94 (BP Location: Right Arm)   Pulse (!) 114   Temp 98.2 F (36.8 C) (Oral)   Resp 18   Ht 5\' 9"  (1.753 m)    Wt 77.1 kg   SpO2 98%   BMI 25.10 kg/m   Physical Exam Vitals and nursing note reviewed.  Constitutional:      Appearance: He is well-developed.     Comments: Smell of alcohol on breath  HENT:     Head: Normocephalic and atraumatic.  Eyes:     Pupils: Pupils are equal, round, and reactive to light.  Neck:     Vascular: No JVD.  Cardiovascular:     Rate and Rhythm: Normal rate and regular rhythm.     Heart sounds: No murmur heard.   No friction rub. No gallop.  Pulmonary:     Effort: No respiratory distress.     Breath sounds: No wheezing.  Abdominal:     General: There is no distension.     Tenderness: There is no abdominal tenderness. There is no guarding or rebound.  Musculoskeletal:        General: Normal range of motion.     Cervical back: Normal range of motion and neck supple.  Skin:    Coloration: Skin is not pale.     Findings: No rash.  Neurological:     Mental Status: He is alert.     Comments: Mildly confused.  Slow to respond.  Psychiatric:        Behavior: Behavior normal.    ED Results / Procedures / Treatments   Labs (all labs ordered are listed, but only abnormal results are displayed) Labs Reviewed - No data to display  EKG None  Radiology No results found.  Procedures Procedures   Medications Ordered in ED Medications - No data to display  ED Course  I have reviewed the triage vital signs and the nursing notes.  Pertinent labs & imaging results that were available during my care of the patient were reviewed by me and considered in my medical decision making (see chart for details).    MDM Rules/Calculators/A&P                           35 yo M with a chief complaint of acute alcohol intoxication.  Patient was hoping that he would be hospitalized from here and go to rehab.  Discussed with him that does not typically in her protocol.  The patient is not actively withdrawing that he is intoxicated.  The friend that is with him would  like me to give him some medicine through the IV to help him.  I discussed that there is not been research that suggests that his IV fluids benefit the patient and that if he can take oral fluids and that would be just as good.  I also discussed watching him here versus being washed at home.  Discussed giving him a Librium taper and giving him outpatient follow-up.  The patient and  family became frustrated that medications and IVs were not being administered and chose to leave the hospital system.  He left prior to receiving his paperwork.  12:35 PM:  I have discussed the diagnosis/risks/treatment options with the patient and believe the pt to be eligible for discharge home to follow-up with PCP. We also discussed returning to the ED immediately if new or worsening sx occur. We discussed the sx which are most concerning (e.g., sudden worsening pain, fever, inability to tolerate by mouth) that necessitate immediate return. Medications administered to the patient during their visit and any new prescriptions provided to the patient are listed below.  Medications given during this visit Medications - No data to display   The patient appears reasonably screen and/or stabilized for discharge and I doubt any other medical condition or other [facility] requiring further screening, evaluation, or treatment in the ED at this time prior to discharge.   Final Clinical Impression(s) / ED Diagnoses Final diagnoses:  Alcoholic intoxication without complication [facility])    Rx / DC Orders ED Discharge Orders     None        Melene Plan, DO 08/09/21 1235

## 2021-08-09 NOTE — ED Notes (Signed)
Requesting DETOX for alcohol abuse. Last drink Vodka on the way to the ED.

## 2021-08-09 NOTE — ED Notes (Signed)
ED MD in to speak patient, patient and visitor stated they were leaving. No AVS was provided. Denies any additional care/ services here at [facility] ED. ED MD aware of pt desire to leave and seek tx else where.

## 2021-08-09 NOTE — ED Triage Notes (Signed)
Pt here for detox. Appears intoxicated during triage. Uncle states drank "over the limit last night". Drank half a 1/5 of liquor over late last night and this morning. States drinks almost daily

## 2023-07-10 ENCOUNTER — Inpatient Hospital Stay (HOSPITAL_COMMUNITY)
Admission: RE | Admit: 2023-07-10 | Discharge: 2023-07-10 | Disposition: A | Discharge status: Home / Self Care | Payer: MEDICAID | Source: Ambulatory Visit

## 2023-09-26 ENCOUNTER — Other Ambulatory Visit (HOSPITAL_BASED_OUTPATIENT_CLINIC_OR_DEPARTMENT_OTHER): Payer: Self-pay

## 2023-09-26 ENCOUNTER — Other Ambulatory Visit: Payer: Self-pay

## 2023-09-26 ENCOUNTER — Emergency Department (HOSPITAL_BASED_OUTPATIENT_CLINIC_OR_DEPARTMENT_OTHER)
Admission: EM | Admit: 2023-09-26 | Discharge: 2023-09-26 | Disposition: A | Discharge status: Home / Self Care | Payer: MEDICAID | Attending: Emergency Medicine | Admitting: Emergency Medicine

## 2023-09-26 ENCOUNTER — Encounter (HOSPITAL_BASED_OUTPATIENT_CLINIC_OR_DEPARTMENT_OTHER): Payer: Self-pay | Admitting: Emergency Medicine

## 2023-09-26 DIAGNOSIS — Z91010 Allergy to peanuts: Secondary | ICD-10-CM | POA: Insufficient documentation

## 2023-09-26 DIAGNOSIS — W1839XA Other fall on same level, initial encounter: Secondary | ICD-10-CM | POA: Insufficient documentation

## 2023-09-26 DIAGNOSIS — S0993XA Unspecified injury of face, initial encounter: Secondary | ICD-10-CM | POA: Diagnosis present

## 2023-09-26 DIAGNOSIS — F10930 Alcohol use, unspecified with withdrawal, uncomplicated: Secondary | ICD-10-CM | POA: Insufficient documentation

## 2023-09-26 DIAGNOSIS — S01511A Laceration without foreign body of lip, initial encounter: Secondary | ICD-10-CM | POA: Diagnosis not present

## 2023-09-26 LAB — CBC WITH DIFFERENTIAL/PLATELET
Abs Immature Granulocytes: 0.02 10*3/uL (ref 0.00–0.07)
Basophils Absolute: 0 10*3/uL (ref 0.0–0.1)
Basophils Relative: 1 %
Eosinophils Absolute: 0 10*3/uL (ref 0.0–0.5)
Eosinophils Relative: 0 %
HCT: 45.2 % (ref 39.0–52.0)
Hemoglobin: 16.3 g/dL (ref 13.0–17.0)
Immature Granulocytes: 0 %
Lymphocytes Relative: 19 %
Lymphs Abs: 1.2 10*3/uL (ref 0.7–4.0)
MCH: 31.9 pg (ref 26.0–34.0)
MCHC: 36.1 g/dL — ABNORMAL HIGH (ref 30.0–36.0)
MCV: 88.5 fL (ref 80.0–100.0)
Monocytes Absolute: 0.5 10*3/uL (ref 0.1–1.0)
Monocytes Relative: 9 %
Neutro Abs: 4.3 10*3/uL (ref 1.7–7.7)
Neutrophils Relative %: 71 %
Platelets: 209 10*3/uL (ref 150–400)
RBC: 5.11 MIL/uL (ref 4.22–5.81)
RDW: 11.8 % (ref 11.5–15.5)
WBC: 6 10*3/uL (ref 4.0–10.5)
nRBC: 0 % (ref 0.0–0.2)

## 2023-09-26 LAB — COMPREHENSIVE METABOLIC PANEL
ALT: 75 U/L — ABNORMAL HIGH (ref 0–44)
AST: 56 U/L — ABNORMAL HIGH (ref 15–41)
Albumin: 4.7 g/dL (ref 3.5–5.0)
Alkaline Phosphatase: 135 U/L — ABNORMAL HIGH (ref 38–126)
Anion gap: 14 (ref 5–15)
BUN: 7 mg/dL (ref 6–20)
CO2: 25 mmol/L (ref 22–32)
Calcium: 9.6 mg/dL (ref 8.9–10.3)
Chloride: 101 mmol/L (ref 98–111)
Creatinine, Ser: 0.71 mg/dL (ref 0.61–1.24)
GFR, Estimated: >60 mL/min (ref >60)
Glucose, Bld: 84 mg/dL (ref 70–99)
Potassium: 3.5 mmol/L (ref 3.5–5.1)
Sodium: 140 mmol/L (ref 135–145)
Total Bilirubin: 1.9 mg/dL — ABNORMAL HIGH (ref <1.2)
Total Protein: 8 g/dL (ref 6.5–8.1)

## 2023-09-26 MED ORDER — CHLORDIAZEPOXIDE HCL 25 MG PO CAPS
25.0000 mg | ORAL_CAPSULE | Freq: Once | ORAL | Status: AC
  Administered 2023-09-26: 25 mg via ORAL
  Filled 2023-09-26: qty 1

## 2023-09-26 MED ORDER — AMOXICILLIN-POT CLAVULANATE 875-125 MG PO TABS
1.0000 | ORAL_TABLET | Freq: Once | ORAL | Status: AC
  Administered 2023-09-26: 1 via ORAL
  Filled 2023-09-26: qty 1

## 2023-09-26 MED ORDER — CHLORDIAZEPOXIDE HCL 25 MG PO CAPS: ORAL_CAPSULE | ORAL | 0 refills | Status: DC

## 2023-09-26 MED ORDER — CHLORDIAZEPOXIDE HCL 25 MG PO CAPS
ORAL_CAPSULE | ORAL | 0 refills | Status: DC
  Filled 2023-09-26: qty 10, fill #0

## 2023-09-26 MED ORDER — AMOXICILLIN-POT CLAVULANATE 875-125 MG PO TABS
1.0000 | ORAL_TABLET | Freq: Two times a day (BID) | ORAL | 0 refills | Status: DC
  Filled 2023-09-26: qty 6, 3d supply, fill #0

## 2023-09-26 MED ORDER — AMOXICILLIN-POT CLAVULANATE 875-125 MG PO TABS: 1.0000 | ORAL_TABLET | Freq: Two times a day (BID) | ORAL | 0 refills | Status: DC

## 2023-09-26 MED ORDER — SODIUM CHLORIDE 0.9 % IV BOLUS
1000.0000 mL | Freq: Once | INTRAVENOUS | Status: AC
  Administered 2023-09-26: 1000 mL via INTRAVENOUS

## 2023-09-26 NOTE — Discharge Instructions (Addendum)
You were seen in the emergency department for alcohol withdrawal and a cut on your upper lip The cut on your lip is already healing and we do not need to put any suturing at at this time We gave you dose of antibiotics to help prevent infection in your lip Please pick up the prescribed antibiotics from your pharmacy and begin taking twice a day for the next 3 days You should also pick up the prescribed Librium from your pharmacy and begin taking as directed for alcohol withdrawal Return to the emergency department for severe alcohol withdrawal with shaking or visual hallucinations Please see the list we provided and call one of the outpatient resources to help with your alcohol abuse Return to the emergency department for any other concerns

## 2023-09-26 NOTE — ED Notes (Signed)
Dc instructions reviewed with patient. Patient voiced understanding. Dc with belongings.  °

## 2023-09-26 NOTE — ED Provider Notes (Signed)
[facility] EMERGENCY DEPARTMENT AT [facility] Provider Note   CSN: [id number] Arrival date & time: 09/26/23  1447     History  Chief Complaint  Patient presents with   Marletta Lor    Devin Hudson is a 37 y.o. male.  With a history of alcohol use disorder presents to the ED for alcohol withdrawal.  Patient drinks approximately 4x 24 ounce [facility] hard lemonade (5% ABV) daily.  Last drink was 1000 today.  Has a history of acute alcohol withdrawal including anxiety, shaking and visual hallucinations.  No history of alcohol withdrawal seizures.  Suffered a fall while intoxicated 4 days ago and sustained a laceration to his upper lip but did not seek medical care at that time.  No other injuries were sustained.  Denies other drug use.  Denies SI HI.  Last detox from alcohol was earlier this year   Fall       Home Medications Prior to Admission medications   Medication Sig Start Date End Date Taking? Authorizing Provider  amoxicillin-clavulanate (AUGMENTIN) 875-125 MG tablet Take 1 tablet by mouth every 12 (twelve) hours. 09/26/23  Yes Devin Foots, DO  chlordiazePOXIDE (LIBRIUM) 25 MG capsule 50mg  PO TID x 1D, then 25-50mg  PO BID X 1D, then 25-50mg  PO QD X 1D 09/26/23  Yes Devin Foots, DO  chlorhexidine (PERIDEX) 0.12 % solution Use as directed 15 mLs in the mouth or throat 2 (two) times daily. 02/03/16   Devin Helper, PA-C  famotidine (PEPCID) 20 MG tablet Take 1 tablet (20 mg total) by mouth 2 (two) times daily. Patient not taking: Reported on 12/08/2014 09/30/14   Devin Madura, PA-C  guaiFENesin (ROBITUSSIN) 100 MG/5ML liquid Take 5-10 mLs (100-200 mg total) by mouth every 4 (four) hours as needed for congestion. 02/03/16   Devin Helper, PA-C  LORazepam (ATIVAN) 1 MG tablet Take 1 tablet (1 mg total) by mouth 3 (three) times daily as needed for anxiety (For anxiety and tremors). Patient not taking: Reported on 04/15/2015 09/30/14   Devin Madura, PA-C  sucralfate (CARAFATE) 1  GM/10ML suspension Take 10 mLs (1 g total) by mouth 4 (four) times daily -  with meals and at bedtime. Patient not taking: Reported on 12/08/2014 09/30/14   Devin Madura, PA-C  traZODone (DESYREL) 50 MG tablet Take 1 tablet (50 mg total) by mouth at bedtime and may repeat dose one time if needed. For sleep Patient not taking: Reported on 04/15/2015 06/12/14   Devin Stammer I, NP      Allergies    Other, Peanut butter flavor, and Peanut-containing drug products    Review of Systems   Review of Systems  Physical Exam Updated Vital Signs BP 129/87   Pulse (!) 107   Temp 98.6 F (37 C) (Oral)   Resp 16   Ht 5\' 10"  (1.778 m)   Wt 68 kg   SpO2 100%   BMI 21.52 kg/m  Physical Exam Vitals and nursing note reviewed.  HENT:     Head: Normocephalic and atraumatic.     Mouth/Throat:     Comments: Healing laceration over right upper lip.  Not full-thickness.  No purulent drainage Missing tooth #9 Eyes:     Pupils: Pupils are equal, round, and reactive to light.  Cardiovascular:     Rate and Rhythm: Normal rate and regular rhythm.  Pulmonary:     Effort: Pulmonary effort is normal.     Breath sounds: Normal breath sounds.  Abdominal:     Palpations:  Abdomen is soft.     Tenderness: There is no abdominal tenderness.  Skin:    General: Skin is warm and dry.  Neurological:     Mental Status: He is alert.  Psychiatric:        Mood and Affect: Mood normal.     ED Results / Procedures / Treatments   Labs (all labs ordered are listed, but only abnormal results are displayed) Labs Reviewed  COMPREHENSIVE METABOLIC PANEL - Abnormal; Notable for the following components:      Result Value   AST 56 (*)    ALT 75 (*)    Alkaline Phosphatase 135 (*)    Total Bilirubin 1.9 (*)    All other components within normal limits  CBC WITH DIFFERENTIAL/PLATELET - Abnormal; Notable for the following components:   MCHC 36.1 (*)    All other components within normal limits     EKG None  Radiology No results found.  Procedures Procedures    Medications Ordered in ED Medications  amoxicillin-clavulanate (AUGMENTIN) 875-125 MG per tablet 1 tablet (has no administration in time range)  chlordiazePOXIDE (LIBRIUM) capsule 25 mg (has no administration in time range)  sodium chloride 0.9 % bolus 1,000 mL (1,000 mLs Intravenous New Bag/Given 09/26/23 1625)  chlordiazePOXIDE (LIBRIUM) capsule 25 mg (25 mg Oral Given 09/26/23 1612)    ED Course/ Medical Decision Making/ A&P Clinical Course as of 09/26/23 1902  Wed Sep 26, 2023  1901 Laboratory workup notable for slightly increased LFTs no other concerning findings.  Mild alcohol withdrawal well-controlled with Librium in the ED.  Will give him another dose while he is here and Cholera sent to his pharmacy for Librium taper at home.  Will also give him a dose of Augmentin with the hope of preventing an infection of his lip laceration call in a few days of Augmentin to his pharmacy as well.  We provided him with a list of outpatient resources to help with alcohol use disorder.  Return precautions that would be worrisome for severe withdrawal were discussed in detail. [MP]    Clinical Course User Index [MP] Devin Foots, DO                                 Medical Decision Making 37 year old male with history of alcohol use disorder presents for complaint of alcohol withdrawal.  Would like to begin detox.  No other drug use.  Drinks 424 ounce mikes hard lemonade daily.  Does have a history of acute withdrawal including anxiety and visual hallucinations.  Last drink was a few hours ago.  Does not appear to be in severe acute withdrawal but he will likely begin withdrawing within the next few hours based on his pattern of alcohol use.  Will obtain basic laboratory workup to look at his liver function and evaluate for potential anemia.  Will provide IV fluids for rehydration along with first dose of Librium.  Based on  my initial evaluation he would be most appropriate for an at home Librium taper with instructions for outpatient resources but will see how he responds to his first dose here and ascertain the need for inpatient medical detox.  Regarding the upper lip laceration.  This wound is already healing by secondary intention and will not require any additional closing.  Will give him a first dose of antibiotic and a short course of antibiotics with the hope of preventing any subsequent  odontogenic infection.  Amount and/or Complexity of Data Reviewed Labs: ordered.  Risk Prescription drug management.           Final Clinical Impression(s) / ED Diagnoses Final diagnoses:  Alcohol withdrawal syndrome without complication (HCC)  Lip laceration, initial encounter    Rx / DC Orders ED Discharge Orders          Ordered    chlordiazePOXIDE (LIBRIUM) 25 MG capsule        09/26/23 1900    amoxicillin-clavulanate (AUGMENTIN) 875-125 MG tablet  Every 12 hours        09/26/23 1900              Devin Foots, DO 09/26/23 1902

## 2023-09-26 NOTE — ED Triage Notes (Signed)
Reports having a fall on 11/23 while heavy drinking. Small lac of r side face. States the pain has not gotten better since the fall. Patient also endorses heavy drinking for the last couple of weeks but has been attempting to stop. Reports cold sweats, chills, nausea, and headache.. Last drink was 1000 today. Reports drinking approx 4 drinks a day.

## 2023-09-28 ENCOUNTER — Other Ambulatory Visit (HOSPITAL_BASED_OUTPATIENT_CLINIC_OR_DEPARTMENT_OTHER): Payer: Self-pay

## 2023-10-13 ENCOUNTER — Emergency Department (HOSPITAL_COMMUNITY)
Admission: EM | Admit: 2023-10-13 | Discharge: 2023-10-13 | Disposition: A | Discharge status: Home / Self Care | Payer: MEDICAID | Attending: Emergency Medicine | Admitting: Emergency Medicine

## 2023-10-13 ENCOUNTER — Other Ambulatory Visit: Payer: Self-pay

## 2023-10-13 ENCOUNTER — Encounter (HOSPITAL_COMMUNITY): Payer: Self-pay | Admitting: Emergency Medicine

## 2023-10-13 DIAGNOSIS — R799 Abnormal finding of blood chemistry, unspecified: Secondary | ICD-10-CM | POA: Diagnosis not present

## 2023-10-13 DIAGNOSIS — F10920 Alcohol use, unspecified with intoxication, uncomplicated: Secondary | ICD-10-CM | POA: Insufficient documentation

## 2023-10-13 DIAGNOSIS — Z91010 Allergy to peanuts: Secondary | ICD-10-CM | POA: Diagnosis not present

## 2023-10-13 DIAGNOSIS — F1090 Alcohol use, unspecified, uncomplicated: Secondary | ICD-10-CM | POA: Diagnosis present

## 2023-10-13 LAB — CBG MONITORING, ED: Glucose-Capillary: 119 mg/dL — ABNORMAL HIGH (ref 70–99)

## 2023-10-13 NOTE — ED Provider Notes (Signed)
[place] [facility] AT [facility] Provider Note   CSN: [id number] Arrival date & time: 10/13/23  0038     History  Chief Complaint  Patient presents with   Alcohol Intoxication    Devin Hudson is a 37 y.o. male.  37 yo M with a chief complaints of drinking alcohol.  He says he drank alcohol most of the day today.  He feels like is intoxicated.  He denies any other complaints.   Alcohol Intoxication       Home Medications Prior to Admission medications   Medication Sig Start Date End Date Taking? Authorizing Provider  amoxicillin-clavulanate (AUGMENTIN) 875-125 MG tablet Take 1 tablet by mouth every 12 (twelve) hours. 09/26/23   Royanne Foots, DO  chlordiazePOXIDE (LIBRIUM) 25 MG capsule 50mg  PO TID x 1D, then 25-50mg  PO BID X 1D, then 25-50mg  PO QD X 1D 09/26/23   Royanne Foots, DO  chlorhexidine (PERIDEX) 0.12 % solution Use as directed 15 mLs in the mouth or throat 2 (two) times daily. 02/03/16   Fayrene Helper, PA-C  famotidine (PEPCID) 20 MG tablet Take 1 tablet (20 mg total) by mouth 2 (two) times daily. Patient not taking: Reported on 12/08/2014 09/30/14   Antony Madura, PA-C  guaiFENesin (ROBITUSSIN) 100 MG/5ML liquid Take 5-10 mLs (100-200 mg total) by mouth every 4 (four) hours as needed for congestion. 02/03/16   Fayrene Helper, PA-C  LORazepam (ATIVAN) 1 MG tablet Take 1 tablet (1 mg total) by mouth 3 (three) times daily as needed for anxiety (For anxiety and tremors). Patient not taking: Reported on 04/15/2015 09/30/14   Antony Madura, PA-C  sucralfate (CARAFATE) 1 GM/10ML suspension Take 10 mLs (1 g total) by mouth 4 (four) times daily -  with meals and at bedtime. Patient not taking: Reported on 12/08/2014 09/30/14   Antony Madura, PA-C  traZODone (DESYREL) 50 MG tablet Take 1 tablet (50 mg total) by mouth at bedtime and may repeat dose one time if needed. For sleep Patient not taking: Reported on 04/15/2015 06/12/14   Armandina Stammer I, NP       Allergies    Other, Peanut butter flavoring agent (non-screening), and Peanut-containing drug products    Review of Systems   Review of Systems  Physical Exam Updated Vital Signs BP 117/78   Pulse 92   Temp 98.1 F (36.7 C)   Resp 18   Ht 5\' 10"  (1.778 m)   Wt 68 kg   SpO2 97%   BMI 21.51 kg/m  Physical Exam Vitals and nursing note reviewed.  Constitutional:      Appearance: He is well-developed.     Comments: Smells of alcohol, slurred speech  HENT:     Head: Normocephalic and atraumatic.  Eyes:     Pupils: Pupils are equal, round, and reactive to light.  Neck:     Vascular: No JVD.  Cardiovascular:     Rate and Rhythm: Normal rate and regular rhythm.     Heart sounds: No murmur heard.    No friction rub. No gallop.  Pulmonary:     Effort: No respiratory distress.     Breath sounds: No wheezing.  Abdominal:     General: There is no distension.     Tenderness: There is no abdominal tenderness. There is no guarding or rebound.  Musculoskeletal:        General: Normal range of motion.     Cervical back: Normal range of motion and neck supple.  Skin:    Coloration: Skin is not pale.     Findings: No rash.  Neurological:     Mental Status: He is alert and oriented to person, place, and time.  Psychiatric:        Behavior: Behavior normal.     ED Results / Procedures / Treatments   Labs (all labs ordered are listed, but only abnormal results are displayed) Labs Reviewed  CBG MONITORING, ED - Abnormal; Notable for the following components:      Result Value   Glucose-Capillary 119 (*)    All other components within normal limits    EKG None  Radiology No results found.  Procedures Procedures    Medications Ordered in ED Medications - No data to display  ED Course/ Medical Decision Making/ A&P                                 Medical Decision Making  37 yo M with a chief complaints of alcohol intoxication.  Patient was found to be very  intoxicated at a party and EMS was called.  Denies any coingestants.  Denies trauma.  Patient has improved while in the emergency department.  He is now awake and alert and able to ambulate without issue.  Will discharge home.  PCP follow-up.  Was given a list of resources if he does choose to quit drinking.  2:31 AM:  I have discussed the diagnosis/risks/treatment options with the patient.  Evaluation and diagnostic testing in the emergency department does not suggest an emergent condition requiring admission or immediate intervention beyond what has been performed at this time.  They will follow up with PCP. We also discussed returning to the ED immediately if new or worsening sx occur. We discussed the sx which are most concerning (e.g., sudden worsening pain, fever, inability to tolerate by mouth) that necessitate immediate return. Medications administered to the patient during their visit and any new prescriptions provided to the patient are listed below.  Medications given during this visit Medications - No data to display   The patient appears reasonably screen and/or stabilized for discharge and I doubt any other medical condition or other [facility] requiring further screening, evaluation, or treatment in the ED at this time prior to discharge.          Final Clinical Impression(s) / ED Diagnoses Final diagnoses:  Alcoholic intoxication without complication [facility])    Rx / DC Orders ED Discharge Orders     None         Melene Plan, DO 10/13/23 0231

## 2023-10-13 NOTE — ED Triage Notes (Signed)
Pt BIB EMS c/o alcohol intoxication. No other complaints.   18g LFA

## 2023-10-13 NOTE — Discharge Instructions (Signed)
Follow up with your family doc in the office.  

## 2024-01-02 ENCOUNTER — Inpatient Hospital Stay (HOSPITAL_COMMUNITY): Payer: MEDICAID

## 2024-01-02 ENCOUNTER — Inpatient Hospital Stay (HOSPITAL_COMMUNITY)
Admission: EM | Admit: 2024-01-02 | Discharge: 2024-01-07 | DRG: 208 | Disposition: A | Discharge status: Home / Self Care | Payer: MEDICAID | Attending: Internal Medicine | Admitting: Internal Medicine

## 2024-01-02 ENCOUNTER — Emergency Department (HOSPITAL_COMMUNITY): Payer: MEDICAID

## 2024-01-02 ENCOUNTER — Encounter (HOSPITAL_COMMUNITY): Payer: Self-pay | Admitting: Emergency Medicine

## 2024-01-02 ENCOUNTER — Other Ambulatory Visit (HOSPITAL_COMMUNITY): Payer: MEDICAID

## 2024-01-02 ENCOUNTER — Other Ambulatory Visit: Payer: Self-pay

## 2024-01-02 DIAGNOSIS — I509 Heart failure, unspecified: Secondary | ICD-10-CM

## 2024-01-02 DIAGNOSIS — E860 Dehydration: Secondary | ICD-10-CM | POA: Diagnosis present

## 2024-01-02 DIAGNOSIS — J9601 Acute respiratory failure with hypoxia: Secondary | ICD-10-CM | POA: Diagnosis present

## 2024-01-02 DIAGNOSIS — Z79899 Other long term (current) drug therapy: Secondary | ICD-10-CM

## 2024-01-02 DIAGNOSIS — R21 Rash and other nonspecific skin eruption: Secondary | ICD-10-CM | POA: Diagnosis present

## 2024-01-02 DIAGNOSIS — F17210 Nicotine dependence, cigarettes, uncomplicated: Secondary | ICD-10-CM | POA: Diagnosis present

## 2024-01-02 DIAGNOSIS — G929 Unspecified toxic encephalopathy: Secondary | ICD-10-CM | POA: Diagnosis present

## 2024-01-02 DIAGNOSIS — E872 Acidosis, unspecified: Secondary | ICD-10-CM | POA: Diagnosis present

## 2024-01-02 DIAGNOSIS — Z91010 Allergy to peanuts: Secondary | ICD-10-CM

## 2024-01-02 DIAGNOSIS — I959 Hypotension, unspecified: Secondary | ICD-10-CM | POA: Diagnosis present

## 2024-01-02 DIAGNOSIS — F10920 Alcohol use, unspecified with intoxication, uncomplicated: Secondary | ICD-10-CM

## 2024-01-02 DIAGNOSIS — B181 Chronic viral hepatitis B without delta-agent: Secondary | ICD-10-CM | POA: Diagnosis present

## 2024-01-02 DIAGNOSIS — Z6820 Body mass index (BMI) 20.0-20.9, adult: Secondary | ICD-10-CM

## 2024-01-02 DIAGNOSIS — F10921 Alcohol use, unspecified with intoxication delirium: Secondary | ICD-10-CM

## 2024-01-02 DIAGNOSIS — R7401 Elevation of levels of liver transaminase levels: Secondary | ICD-10-CM | POA: Diagnosis present

## 2024-01-02 DIAGNOSIS — F10120 Alcohol abuse with intoxication, uncomplicated: Secondary | ICD-10-CM | POA: Diagnosis present

## 2024-01-02 DIAGNOSIS — N179 Acute kidney failure, unspecified: Secondary | ICD-10-CM | POA: Diagnosis present

## 2024-01-02 DIAGNOSIS — I21A1 Myocardial infarction type 2: Secondary | ICD-10-CM | POA: Diagnosis present

## 2024-01-02 DIAGNOSIS — E440 Moderate protein-calorie malnutrition: Secondary | ICD-10-CM | POA: Diagnosis present

## 2024-01-02 DIAGNOSIS — Z91018 Allergy to other foods: Secondary | ICD-10-CM | POA: Diagnosis not present

## 2024-01-02 DIAGNOSIS — R680 Hypothermia, not associated with low environmental temperature: Secondary | ICD-10-CM | POA: Diagnosis present

## 2024-01-02 DIAGNOSIS — F1910 Other psychoactive substance abuse, uncomplicated: Secondary | ICD-10-CM | POA: Diagnosis present

## 2024-01-02 DIAGNOSIS — R7989 Other specified abnormal findings of blood chemistry: Secondary | ICD-10-CM | POA: Diagnosis present

## 2024-01-02 DIAGNOSIS — G928 Other toxic encephalopathy: Secondary | ICD-10-CM | POA: Diagnosis present

## 2024-01-02 DIAGNOSIS — R739 Hyperglycemia, unspecified: Secondary | ICD-10-CM | POA: Diagnosis present

## 2024-01-02 DIAGNOSIS — E876 Hypokalemia: Secondary | ICD-10-CM | POA: Diagnosis present

## 2024-01-02 DIAGNOSIS — Y908 Blood alcohol level of 240 mg/100 ml or more: Secondary | ICD-10-CM | POA: Diagnosis present

## 2024-01-02 LAB — URINALYSIS, ROUTINE W REFLEX MICROSCOPIC
Bacteria, UA: NONE SEEN
Bilirubin Urine: NEGATIVE
Glucose, UA: NEGATIVE mg/dL
Ketones, ur: NEGATIVE mg/dL
Leukocytes,Ua: NEGATIVE
Nitrite: NEGATIVE
Protein, ur: NEGATIVE mg/dL
Specific Gravity, Urine: 1.008 (ref 1.005–1.030)
pH: 5 (ref 5.0–8.0)

## 2024-01-02 LAB — ECHOCARDIOGRAM COMPLETE
AR max vel: 1.76 cm2
AV Area VTI: 1.81 cm2
AV Area mean vel: 1.7 cm2
AV Mean grad: 2 mmHg
AV Peak grad: 4 mmHg
Ao pk vel: 1 m/s
Area-P 1/2: 3.34 cm2
Calc EF: 49.6 %
Height: 70 in
MV VTI: 1.79 cm2
S' Lateral: 2.85 cm
Single Plane A2C EF: 47.7 %
Single Plane A4C EF: 50.1 %
Weight: 2282.2 [oz_av]

## 2024-01-02 LAB — CBC
HCT: 40.2 % (ref 39.0–52.0)
Hemoglobin: 13.8 g/dL (ref 13.0–17.0)
MCH: 33 pg (ref 26.0–34.0)
MCHC: 34.3 g/dL (ref 30.0–36.0)
MCV: 96.2 fL (ref 80.0–100.0)
Platelets: 196 10*3/uL (ref 150–400)
RBC: 4.18 MIL/uL — ABNORMAL LOW (ref 4.22–5.81)
RDW: 13.9 % (ref 11.5–15.5)
WBC: 10.6 10*3/uL — ABNORMAL HIGH (ref 4.0–10.5)
nRBC: 0 % (ref 0.0–0.2)

## 2024-01-02 LAB — BLOOD GAS, ARTERIAL
Acid-base deficit: 11.5 mmol/L — ABNORMAL HIGH (ref 0.0–2.0)
Bicarbonate: 13.8 mmol/L — ABNORMAL LOW (ref 20.0–28.0)
Drawn by: 50037
FIO2: 100 %
MECHVT: 560 mL
O2 Saturation: 91.3 %
PEEP: 5 cmH2O
Patient temperature: 34.8
RATE: 20 {breaths}/min
pCO2 arterial: 27 mmHg — ABNORMAL LOW (ref 32–48)
pH, Arterial: 7.3 — ABNORMAL LOW (ref 7.35–7.45)
pO2, Arterial: 420 mmHg — ABNORMAL HIGH (ref 83–108)

## 2024-01-02 LAB — CBC WITH DIFFERENTIAL/PLATELET
Abs Immature Granulocytes: 0 10*3/uL (ref 0.00–0.07)
Basophils Absolute: 0.2 10*3/uL — ABNORMAL HIGH (ref 0.0–0.1)
Basophils Relative: 3 %
Eosinophils Absolute: 0 10*3/uL (ref 0.0–0.5)
Eosinophils Relative: 0 %
HCT: 45 % (ref 39.0–52.0)
Hemoglobin: 14.9 g/dL (ref 13.0–17.0)
Lymphocytes Relative: 43 %
Lymphs Abs: 3.2 10*3/uL (ref 0.7–4.0)
MCH: 33 pg (ref 26.0–34.0)
MCHC: 33.1 g/dL (ref 30.0–36.0)
MCV: 99.6 fL (ref 80.0–100.0)
Monocytes Absolute: 0.1 10*3/uL (ref 0.1–1.0)
Monocytes Relative: 2 %
Neutro Abs: 3.8 10*3/uL (ref 1.7–7.7)
Neutrophils Relative %: 52 %
Platelets: 243 10*3/uL (ref 150–400)
RBC: 4.52 MIL/uL (ref 4.22–5.81)
RDW: 13.5 % (ref 11.5–15.5)
WBC: 7.4 10*3/uL (ref 4.0–10.5)
nRBC: 0 % (ref 0.0–0.2)

## 2024-01-02 LAB — COMPREHENSIVE METABOLIC PANEL
ALT: 58 U/L — ABNORMAL HIGH (ref 0–44)
ALT: 53 U/L — ABNORMAL HIGH (ref 0–44)
AST: 122 U/L — ABNORMAL HIGH (ref 15–41)
AST: 105 U/L — ABNORMAL HIGH (ref 15–41)
Albumin: 3.3 g/dL — ABNORMAL LOW (ref 3.5–5.0)
Albumin: 2.7 g/dL — ABNORMAL LOW (ref 3.5–5.0)
Alkaline Phosphatase: 70 U/L (ref 38–126)
Alkaline Phosphatase: 55 U/L (ref 38–126)
Anion gap: 19 — ABNORMAL HIGH (ref 5–15)
Anion gap: 14 (ref 5–15)
BUN: 16 mg/dL (ref 6–20)
BUN: 17 mg/dL (ref 6–20)
CO2: 16 mmol/L — ABNORMAL LOW (ref 22–32)
CO2: 18 mmol/L — ABNORMAL LOW (ref 22–32)
Calcium: 7.3 mg/dL — ABNORMAL LOW (ref 8.9–10.3)
Calcium: 6.8 mg/dL — ABNORMAL LOW (ref 8.9–10.3)
Chloride: 106 mmol/L (ref 98–111)
Chloride: 105 mmol/L (ref 98–111)
Creatinine, Ser: 1.56 mg/dL — ABNORMAL HIGH (ref 0.61–1.24)
Creatinine, Ser: 1.14 mg/dL (ref 0.61–1.24)
GFR, Estimated: 59 mL/min — ABNORMAL LOW (ref >60)
GFR, Estimated: >60 mL/min (ref >60)
Glucose, Bld: 195 mg/dL — ABNORMAL HIGH (ref 70–99)
Glucose, Bld: 102 mg/dL — ABNORMAL HIGH (ref 70–99)
Potassium: 3.7 mmol/L (ref 3.5–5.1)
Potassium: 3.8 mmol/L (ref 3.5–5.1)
Sodium: 141 mmol/L (ref 135–145)
Sodium: 137 mmol/L (ref 135–145)
Total Bilirubin: 1.7 mg/dL — ABNORMAL HIGH (ref 0.0–1.2)
Total Bilirubin: 2.1 mg/dL — ABNORMAL HIGH (ref 0.0–1.2)
Total Protein: 6.6 g/dL (ref 6.5–8.1)
Total Protein: 5.4 g/dL — ABNORMAL LOW (ref 6.5–8.1)

## 2024-01-02 LAB — I-STAT CHEM 8, ED
BUN: 14 mg/dL (ref 6–20)
Calcium, Ion: 0.87 mmol/L — CL (ref 1.15–1.40)
Chloride: 108 mmol/L (ref 98–111)
Creatinine, Ser: 1.9 mg/dL — ABNORMAL HIGH (ref 0.61–1.24)
Glucose, Bld: 187 mg/dL — ABNORMAL HIGH (ref 70–99)
HCT: 46 % (ref 39.0–52.0)
Hemoglobin: 15.6 g/dL (ref 13.0–17.0)
Potassium: 3.8 mmol/L (ref 3.5–5.1)
Sodium: 142 mmol/L (ref 135–145)
TCO2: 18 mmol/L — ABNORMAL LOW (ref 22–32)

## 2024-01-02 LAB — TROPONIN I (HIGH SENSITIVITY)
Troponin I (High Sensitivity): 7 ng/L (ref <18)
Troponin I (High Sensitivity): 456 ng/L (ref <18)
Troponin I (High Sensitivity): 820 ng/L (ref <18)
Troponin I (High Sensitivity): 593 ng/L (ref <18)

## 2024-01-02 LAB — AMMONIA: Ammonia: 22 umol/L (ref 9–35)

## 2024-01-02 LAB — GLUCOSE, CAPILLARY
Glucose-Capillary: 124 mg/dL — ABNORMAL HIGH (ref 70–99)
Glucose-Capillary: 114 mg/dL — ABNORMAL HIGH (ref 70–99)
Glucose-Capillary: 117 mg/dL — ABNORMAL HIGH (ref 70–99)
Glucose-Capillary: 99 mg/dL (ref 70–99)
Glucose-Capillary: 85 mg/dL (ref 70–99)

## 2024-01-02 LAB — RAPID URINE DRUG SCREEN, HOSP PERFORMED
Amphetamines: NOT DETECTED
Barbiturates: NOT DETECTED
Benzodiazepines: NOT DETECTED
Cocaine: NOT DETECTED
Opiates: NOT DETECTED
Tetrahydrocannabinol: NOT DETECTED

## 2024-01-02 LAB — ETHANOL: Alcohol, Ethyl (B): 542 mg/dL (ref <10)

## 2024-01-02 LAB — LACTIC ACID, PLASMA
Lactic Acid, Venous: >9 mmol/L (ref 0.5–1.9)
Lactic Acid, Venous: 8.9 mmol/L (ref 0.5–1.9)
Lactic Acid, Venous: 5.8 mmol/L (ref 0.5–1.9)
Lactic Acid, Venous: 3.5 mmol/L (ref 0.5–1.9)
Lactic Acid, Venous: 3.2 mmol/L (ref 0.5–1.9)

## 2024-01-02 LAB — CBG MONITORING, ED
Glucose-Capillary: 180 mg/dL — ABNORMAL HIGH (ref 70–99)
Glucose-Capillary: 183 mg/dL — ABNORMAL HIGH (ref 70–99)

## 2024-01-02 LAB — TSH: TSH: 0.864 u[IU]/mL (ref 0.350–4.500)

## 2024-01-02 LAB — MAGNESIUM: Magnesium: 1.6 mg/dL — ABNORMAL LOW (ref 1.7–2.4)

## 2024-01-02 LAB — PREPARE RBC (CROSSMATCH)

## 2024-01-02 LAB — POC OCCULT BLOOD, ED: Fecal Occult Bld: NEGATIVE

## 2024-01-02 LAB — LIPASE, BLOOD: Lipase: 58 U/L — ABNORMAL HIGH (ref 11–51)

## 2024-01-02 LAB — ACETAMINOPHEN LEVEL: Acetaminophen (Tylenol), Serum: <10 ug/mL — ABNORMAL LOW (ref 10–30)

## 2024-01-02 LAB — CK: Total CK: 386 U/L (ref 49–397)

## 2024-01-02 LAB — PHOSPHORUS: Phosphorus: 2.7 mg/dL (ref 2.5–4.6)

## 2024-01-02 LAB — PROTIME-INR
INR: 1.2 (ref 0.8–1.2)
Prothrombin Time: 15.2 s (ref 11.4–15.2)

## 2024-01-02 LAB — HIV ANTIBODY (ROUTINE TESTING W REFLEX): HIV Screen 4th Generation wRfx: NONREACTIVE

## 2024-01-02 LAB — SALICYLATE LEVEL: Salicylate Lvl: <7 mg/dL — ABNORMAL LOW (ref 7.0–30.0)

## 2024-01-02 LAB — MRSA NEXT GEN BY PCR, NASAL: MRSA by PCR Next Gen: NOT DETECTED

## 2024-01-02 LAB — HEMOGLOBIN A1C
Hgb A1c MFr Bld: 5.7 % — ABNORMAL HIGH (ref 4.8–5.6)
Mean Plasma Glucose: 116.89 mg/dL

## 2024-01-02 LAB — ABO/RH: ABO/RH(D): A POS

## 2024-01-02 MED ORDER — VANCOMYCIN HCL IN DEXTROSE 1-5 GM/200ML-% IV SOLN
1000.0000 mg | Freq: Once | INTRAVENOUS | Status: AC
  Administered 2024-01-02: 1000 mg via INTRAVENOUS
  Filled 2024-01-02: qty 200

## 2024-01-02 MED ORDER — HYDROXYZINE HCL 25 MG PO TABS: 25.0000 mg | ORAL_TABLET | Freq: Four times a day (QID) | ORAL | Status: DC | PRN

## 2024-01-02 MED ORDER — LACTATED RINGERS IV SOLN: INTRAVENOUS | Status: AC

## 2024-01-02 MED ORDER — SODIUM BICARBONATE 8.4 % IV SOLN
100.0000 meq | Freq: Once | INTRAVENOUS | Status: AC
  Administered 2024-01-02: 100 meq via INTRAVENOUS
  Filled 2024-01-02: qty 50

## 2024-01-02 MED ORDER — SODIUM CHLORIDE 0.9% IV SOLUTION: Freq: Once | INTRAVENOUS | Status: DC

## 2024-01-02 MED ORDER — ONDANSETRON 4 MG PO TBDP
4.0000 mg | ORAL_TABLET | Freq: Four times a day (QID) | ORAL | Status: DC | PRN
  Administered 2024-01-04: 4 mg via ORAL
  Filled 2024-01-02: qty 1

## 2024-01-02 MED ORDER — SODIUM CHLORIDE 0.9 % IV BOLUS: 1000.0000 mL | Freq: Once | INTRAVENOUS | Status: DC

## 2024-01-02 MED ORDER — CHLORDIAZEPOXIDE HCL 25 MG PO CAPS
50.0000 mg | ORAL_CAPSULE | Freq: Once | ORAL | Status: AC
  Administered 2024-01-02: 50 mg
  Filled 2024-01-02: qty 2

## 2024-01-02 MED ORDER — MAGNESIUM SULFATE 2 GM/50ML IV SOLN
2.0000 g | Freq: Once | INTRAVENOUS | Status: AC
  Administered 2024-01-02: 2 g via INTRAVENOUS
  Filled 2024-01-02: qty 50

## 2024-01-02 MED ORDER — THIAMINE HCL 100 MG/ML IJ SOLN
100.0000 mg | Freq: Once | INTRAMUSCULAR | Status: AC
  Administered 2024-01-02: 100 mg via INTRAMUSCULAR
  Filled 2024-01-02: qty 2

## 2024-01-02 MED ORDER — POLYETHYLENE GLYCOL 3350 17 G PO PACK: 17.0000 g | PACK | Freq: Every day | ORAL | Status: DC | PRN

## 2024-01-02 MED ORDER — ROCURONIUM BROMIDE 10 MG/ML (PF) SYRINGE
80.0000 mg | PREFILLED_SYRINGE | Freq: Once | INTRAVENOUS | Status: AC
  Administered 2024-01-02: 80 mg via INTRAVENOUS

## 2024-01-02 MED ORDER — CHLORDIAZEPOXIDE HCL 25 MG PO CAPS
25.0000 mg | ORAL_CAPSULE | Freq: Four times a day (QID) | ORAL | Status: AC
  Administered 2024-01-02 (×4): 25 mg
  Filled 2024-01-02 (×4): qty 1

## 2024-01-02 MED ORDER — PERFLUTREN LIPID MICROSPHERE
1.0000 mL | INTRAVENOUS | Status: AC | PRN
  Administered 2024-01-02: 3 mL via INTRAVENOUS

## 2024-01-02 MED ORDER — FENTANYL CITRATE PF 50 MCG/ML IJ SOSY
50.0000 ug | PREFILLED_SYRINGE | INTRAMUSCULAR | Status: DC | PRN
  Administered 2024-01-02 (×2): 50 ug via INTRAVENOUS
  Filled 2024-01-02 (×2): qty 1

## 2024-01-02 MED ORDER — FOLIC ACID 1 MG PO TABS
1.0000 mg | ORAL_TABLET | Freq: Every day | ORAL | Status: DC
  Administered 2024-01-02: 1 mg
  Filled 2024-01-02: qty 1

## 2024-01-02 MED ORDER — LACTATED RINGERS IV BOLUS
1000.0000 mL | Freq: Once | INTRAVENOUS | Status: AC
  Administered 2024-01-02: 1000 mL via INTRAVENOUS

## 2024-01-02 MED ORDER — SODIUM BICARBONATE 8.4 % IV SOLN
INTRAVENOUS | Status: AC
Start: 2024-01-02 — End: 2024-01-02
  Filled 2024-01-02: qty 50

## 2024-01-02 MED ORDER — CHLORDIAZEPOXIDE HCL 25 MG PO CAPS: 25.0000 mg | ORAL_CAPSULE | Freq: Every day | ORAL | Status: DC

## 2024-01-02 MED ORDER — LOPERAMIDE HCL 1 MG/7.5ML PO SUSP
2.0000 mg | ORAL | Status: DC | PRN
  Filled 2024-01-02: qty 30

## 2024-01-02 MED ORDER — NOREPINEPHRINE 4 MG/250ML-% IV SOLN
2.0000 ug/min | INTRAVENOUS | Status: DC
  Administered 2024-01-02: 2 ug/min via INTRAVENOUS
  Filled 2024-01-02: qty 250

## 2024-01-02 MED ORDER — FENTANYL 2500MCG IN NS 250ML (10MCG/ML) PREMIX INFUSION
0.0000 ug/h | INTRAVENOUS | Status: DC
Start: 2024-01-02 — End: 2024-01-03
  Administered 2024-01-02: 150 ug/h via INTRAVENOUS
  Administered 2024-01-02: 25 ug/h via INTRAVENOUS
  Filled 2024-01-02 (×2): qty 250

## 2024-01-02 MED ORDER — CHLORDIAZEPOXIDE HCL 25 MG PO CAPS: 25.0000 mg | ORAL_CAPSULE | Freq: Four times a day (QID) | ORAL | Status: DC | PRN

## 2024-01-02 MED ORDER — CHLORHEXIDINE GLUCONATE CLOTH 2 % EX PADS
6.0000 | MEDICATED_PAD | Freq: Every day | CUTANEOUS | Status: DC
  Administered 2024-01-02 – 2024-01-04 (×3): 6 via TOPICAL

## 2024-01-02 MED ORDER — PANTOPRAZOLE SODIUM 40 MG IV SOLR
40.0000 mg | Freq: Every day | INTRAVENOUS | Status: DC
  Administered 2024-01-02: 40 mg via INTRAVENOUS
  Filled 2024-01-02: qty 10

## 2024-01-02 MED ORDER — SODIUM CHLORIDE 0.9 % IV SOLN
2.0000 g | Freq: Once | INTRAVENOUS | Status: AC
  Administered 2024-01-02: 2 g via INTRAVENOUS
  Filled 2024-01-02: qty 12.5

## 2024-01-02 MED ORDER — DOCUSATE SODIUM 50 MG/5ML PO LIQD: 100.0000 mg | Freq: Two times a day (BID) | ORAL | Status: DC | PRN

## 2024-01-02 MED ORDER — HEPARIN SODIUM (PORCINE) 5000 UNIT/ML IJ SOLN
5000.0000 [IU] | Freq: Three times a day (TID) | INTRAMUSCULAR | Status: DC
  Administered 2024-01-02 – 2024-01-07 (×14): 5000 [IU] via SUBCUTANEOUS
  Filled 2024-01-02 (×14): qty 1

## 2024-01-02 MED ORDER — SODIUM CHLORIDE 0.9 % IV SOLN: 250.0000 mL | INTRAVENOUS | Status: AC

## 2024-01-02 MED ORDER — ETOMIDATE 2 MG/ML IV SOLN
20.0000 mg | Freq: Once | INTRAVENOUS | Status: AC
  Administered 2024-01-02: 20 mg via INTRAVENOUS

## 2024-01-02 MED ORDER — CHLORDIAZEPOXIDE HCL 25 MG PO CAPS: 25.0000 mg | ORAL_CAPSULE | Freq: Three times a day (TID) | ORAL | Status: DC

## 2024-01-02 MED ORDER — PROPOFOL 1000 MG/100ML IV EMUL
0.0000 ug/kg/min | INTRAVENOUS | Status: DC
  Administered 2024-01-02: 30 ug/kg/min via INTRAVENOUS
  Administered 2024-01-02: 50 ug/kg/min via INTRAVENOUS
  Administered 2024-01-02: 20 ug/kg/min via INTRAVENOUS
  Administered 2024-01-03: 30 ug/kg/min via INTRAVENOUS
  Administered 2024-01-03: 10 ug/kg/min via INTRAVENOUS
  Filled 2024-01-02 (×5): qty 100

## 2024-01-02 MED ORDER — ORAL CARE MOUTH RINSE
15.0000 mL | OROMUCOSAL | Status: DC
  Administered 2024-01-02 – 2024-01-03 (×14): 15 mL via OROMUCOSAL

## 2024-01-02 MED ORDER — ADULT MULTIVITAMIN LIQUID CH
15.0000 mL | Freq: Every day | ORAL | Status: DC
  Administered 2024-01-02: 15 mL
  Filled 2024-01-02: qty 15

## 2024-01-02 MED ORDER — THIAMINE MONONITRATE 100 MG PO TABS
100.0000 mg | ORAL_TABLET | Freq: Every day | ORAL | Status: DC
  Filled 2024-01-02: qty 1

## 2024-01-02 MED ORDER — PROPOFOL 1000 MG/100ML IV EMUL
INTRAVENOUS | Status: AC
  Administered 2024-01-02: 5 ug/kg/min via INTRAVENOUS
  Filled 2024-01-02: qty 100

## 2024-01-02 MED ORDER — DEXMEDETOMIDINE HCL IN NACL 400 MCG/100ML IV SOLN
0.0000 ug/kg/h | INTRAVENOUS | Status: DC
  Administered 2024-01-02: 0.4 ug/kg/h via INTRAVENOUS
  Administered 2024-01-02: 0.8 ug/kg/h via INTRAVENOUS
  Administered 2024-01-02: 1.2 ug/kg/h via INTRAVENOUS
  Administered 2024-01-03 (×2): 0.7 ug/kg/h via INTRAVENOUS
  Filled 2024-01-02 (×5): qty 100

## 2024-01-02 MED ORDER — ORAL CARE MOUTH RINSE: 15.0000 mL | OROMUCOSAL | Status: DC | PRN

## 2024-01-02 MED ORDER — CHLORDIAZEPOXIDE HCL 25 MG PO CAPS: 25.0000 mg | ORAL_CAPSULE | ORAL | Status: DC

## 2024-01-02 MED ORDER — FENTANYL CITRATE PF 50 MCG/ML IJ SOSY
50.0000 ug | PREFILLED_SYRINGE | INTRAMUSCULAR | Status: DC | PRN
  Administered 2024-01-02: 200 ug via INTRAVENOUS
  Filled 2024-01-02: qty 4

## 2024-01-02 NOTE — ED Notes (Signed)
 Patient has two belonging bags , In one of his belonging bags is his passport.

## 2024-01-02 NOTE — ED Notes (Addendum)
 20 etomidate administered 80 roc administered

## 2024-01-02 NOTE — ED Notes (Signed)
 1 unit emergency blood finished.

## 2024-01-02 NOTE — ED Notes (Signed)
 Prop 5 administered

## 2024-01-02 NOTE — Progress Notes (Signed)
 Afternoon: still on quite a bit of sedation. He did wake up briefly. Working on titrating down so that he can SBT and extubate. Mom has concerns about withdrawal and explained librium taper to her. She would like to speak with SW tomorrow morning if possible. Additionally, when he is extubated, should have psychiatric evaluation.

## 2024-01-02 NOTE — Progress Notes (Signed)
   01/02/24 1526  TOC Brief Assessment  Insurance and Status Reviewed  Patient has primary care physician No  Home environment has been reviewed Single family home  Prior level of function: Independent  Prior/Current Home Services No current home services  Social Drivers of Health Review SDOH reviewed no interventions necessary  Readmission risk has been reviewed Yes  Transition of care needs transition of care needs identified, TOC will continue to follow   Pt currently intubated and unable to participate in conversation. Substance use resources have been placed on AVS. TOC will follow up with pt once extubated to discuss resources.

## 2024-01-02 NOTE — Progress Notes (Signed)
 PCCM Interval Progress Note:  37yoM admitted early AM for acute alcohol intoxication, unresponsiveness requiring intubation in the ED. Reportedly drinking with friends all day and found down at the house. He was very pale in the ED with concern for possible GIB and was given an emergency unit of blood. Labs without anemia. FOB negative.  He had lactic acidosis of >9. Alcohol 549. He was treated empirically for sepsis as well given that he was hypotensive, hypoxic and hypothermic. Imaging negative. Admitted to ICU.  Exam: General: young male, laying in bed, no acute distress  HEENT: PERRLA, anicteric sclera, mucous membranes moist, ett, ogt Lungs: clear bilaterally, vented, no wheezing, rhonchi, rales  Cardiac: s1s2 without murmur, rub, gallop Abd: flat, soft, non-distended, +BS Neuro: sedated Ext: no edema, warm GU: foley   Labs: AST 105, ALT 53, T bili 2.1  Lactic 9>8.9>5.8 CK normal  Troponin 456  HIV negative Ammonia negative  Lipase 58 Tylenol/salicylates negative   Assessment and Plan  Acute severe alcohol intoxication  Alcohol use disorder  Elevated transaminases  Appears to have multiple elevated ethanol levels from before. Mother at bedside says this has been going on for years. Never intubated for this. Has not been able to get him into rehab yet.  - start librium taper while intubated  - folic acid, thiamine, mtvn  - f/u RUQ [place]  - trend LFTs  - CIWA Ativan PRN to be ordered once sedation is weaned  - loperamide prn for diarrhea  - atarax prn for anxiety  - TOC consult placed for substance abuse   Acute hypoxic respiratory failure 2/2 above  On minimal vent settings  - wean sedation to allow for SBT today  - full mechanical vent support - lung protective ventilation 6-8cc/kg Vt - VAP and PAD bundle in place  - titrate FiO2 to sat goal >92  - maintain peak/plats <30, driving pressures <69    Acute Kidney Injury  Likely prerenal azotemia in the setting of  dehydration and hypotension from above. - con't LR 100/hr  - cr coming down  - trend bmp, mag, phos  - strict I/O - making urine  - renally dose meds as needed  - avoid nephrotoxic agents   Lactic acidosis  Clearing  - con't fluids  - trend   Elevated troponin  Trop 7>456. EKG non ischemic. May be stress related.  - repeat troponin now until peak  - f/u echo

## 2024-01-02 NOTE — H&P (Signed)
 NAME:  Devin Hudson, MRN:  [id number], DOB:  23-Jan-1986, LOS: 0 ADMISSION DATE:  01/02/2024, CONSULTATION DATE:  01/02/24 REFERRING MD:  EDP, CHIEF COMPLAINT:  acute hypoxic resp failure   History of Present Illness:  38 yo male found down with acute etoh intoxication. Unable to provide history, hypoxic and appeared pale at presentation given emergent unit of blood prior to lab return. EMS states no indication of trauma.   On eval in ed he was emergently intubated has not started vasopressors  Pertinent  Medical History  unknon\wn  Significant Hospital Events: Including procedures, antibiotic start and stop dates in addition to other pertinent events   Admitted to ICU 3/5  Interim History / Subjective:    Objective   Blood pressure (!) 83/59, pulse (!) 101, temperature 99.6 F (37.6 C), resp. rate 20, height 5\' 9"  (1.753 m), weight 68 kg, SpO2 92%.    Vent Mode: PRVC FiO2 (%):  [40 %-100 %] 40 % Set Rate:  [20 bmp] 20 bmp Vt Set:  [560 mL] 560 mL PEEP:  [5 cmH20] 5 cmH20 Plateau Pressure:  [15 cmH20] 15 cmH20  No intake or output data in the 24 hours ending 01/02/24 0533 Filed Weights   01/02/24 0114  Weight: 68 kg    Examination: General: appears acutely ill, intubated and sedated HENT: ncat, perrla, pale mm Lungs: ctab Cardiovascular: rrr Abdomen: soft nt/nd bs + Extremities: no c/c/e Neuro: unresponsive on vent GU: deferred  Resolved Hospital Problem list     Assessment & Plan:  Acute alcohol intoxication Acute hypoxic resp failure Lactic acidosis [place]ki Hyperglycemia Transaminitis -monitor for withdrawal -clear signs of etohism with liver enzymes -cont with voluyme -f/u trop -echo pending -trend uop and renal indicies -cth negative for acute abnormality -ammonia negative -if lactate does not cont to improve would send for ct abd/pelvis -uds negative -will need substance abuse counseling  Best Practice (right click and "Reselect all SmartList  Selections" daily)   Diet/type: tubefeeds DVT prophylaxis SCD Pressure ulcer(s): N/A GI prophylaxis: H2B Lines: N/A Foley:  N/A Code Status:  full code Last date of multidisciplinary goals of care discussion [pedning d./w family as well]  Labs   CBC: Recent Labs  Lab 01/02/24 0115 01/02/24 0124  WBC 7.4  --   NEUTROABS 3.8  --   HGB 14.9 15.6  HCT 45.0 46.0  MCV 99.6  --   PLT 243  --     Basic Metabolic Panel: Recent Labs  Lab 01/02/24 0115 01/02/24 0124  NA 141 142  K 3.7 3.8  CL 106 108  CO2 16*  --   GLUCOSE 195* 187*  BUN 16 14  CREATININE 1.56* 1.90*  CALCIUM 7.3*  --    GFR: Estimated Creatinine Clearance: 52.2 mL/min (A) (by C-G formula based on SCr of 1.9 mg/dL (H)). Recent Labs  Lab 01/02/24 0115 01/02/24 0340  WBC 7.4  --   LATICACIDVEN >9.0* 8.9*    Liver Function Tests: Recent Labs  Lab 01/02/24 0115  AST 122*  ALT 58*  ALKPHOS 70  BILITOT 1.7*  PROT 6.6  ALBUMIN 3.3*   Recent Labs  Lab 01/02/24 0115  LIPASE 58*   Recent Labs  Lab 01/02/24 0115  AMMONIA 22    ABG    Component Value Date/Time   PHART 7.3 (L) 01/02/2024 0245   PCO2ART 27 (L) 01/02/2024 0245   PO2ART 420 (H) 01/02/2024 0245   HCO3 13.8 (L) 01/02/2024 0245   TCO2 18 (L) 01/02/2024  0124   ACIDBASEDEF 11.5 (H) 01/02/2024 0245   O2SAT 91.3 01/02/2024 0245     Coagulation Profile: Recent Labs  Lab 01/02/24 0115  INR 1.2    Cardiac Enzymes: No results for input(s): "CKTOTAL", "CKMB", "CKMBINDEX", "TROPONINI" in the last 168 hours.  HbA1C: Hgb A1c MFr Bld  Date/Time Value Ref Range Status  06/11/2014 06:15 AM 5.5 <5.7 % Final    Comment:    (NOTE)                                                                       According to the ADA Clinical Practice Recommendations for 2011, when HbA1c is used as a screening test:  >=6.5%   Diagnostic of Diabetes Mellitus           (if abnormal result is confirmed) 5.7-6.4%   Increased risk of developing  Diabetes Mellitus References:Diagnosis and Classification of Diabetes Mellitus,Diabetes Care,2011,34(Suppl 1):S62-S69 and Standards of Medical Care in         Diabetes - 2011,Diabetes Care,2011,34 (Suppl 1):S11-S61.    CBG: Recent Labs  Lab 01/02/24 0058 01/02/24 0101  GLUCAP 180* 183*    Review of Systems:   Unobtainable 2/2 intubated sedated status.   Past Medical History:  He,  has a past medical history of Alcohol abuse and Liver damage.   Surgical History:   Past Surgical History:  Procedure Laterality Date   FINGER ARTHROPLASTY Right    TONSILLECTOMY       Social History:   reports that he has been smoking cigarettes. He has never used smokeless tobacco. He reports that he does not drink alcohol and does not use drugs.   Family History:  His family history is not on file.   Allergies Allergies  Allergen Reactions   Other Other (See Comments)    Bologna allergy, throat swelling at child.    Peanut Butter Flavoring Agent (Non-Screening) Anaphylaxis and Hives   Peanut-Containing Drug Products Anaphylaxis and Hives     Home Medications  Prior to Admission medications   Medication Sig Start Date End Date Taking? Authorizing Provider  amoxicillin-clavulanate (AUGMENTIN) 875-125 MG tablet Take 1 tablet by mouth every 12 (twelve) hours. 09/26/23   Royanne Foots, DO  chlordiazePOXIDE (LIBRIUM) 25 MG capsule 50mg  PO TID x 1D, then 25-50mg  PO BID X 1D, then 25-50mg  PO QD X 1D 09/26/23   Royanne Foots, DO  chlorhexidine (PERIDEX) 0.12 % solution Use as directed 15 mLs in the mouth or throat 2 (two) times daily. 02/03/16   Fayrene Helper, PA-C  famotidine (PEPCID) 20 MG tablet Take 1 tablet (20 mg total) by mouth 2 (two) times daily. Patient not taking: Reported on 12/08/2014 09/30/14   Antony Madura, PA-C  guaiFENesin (ROBITUSSIN) 100 MG/5ML liquid Take 5-10 mLs (100-200 mg total) by mouth every 4 (four) hours as needed for congestion. 02/03/16   Fayrene Helper, PA-C  LORazepam  (ATIVAN) 1 MG tablet Take 1 tablet (1 mg total) by mouth 3 (three) times daily as needed for anxiety (For anxiety and tremors). Patient not taking: Reported on 04/15/2015 09/30/14   Antony Madura, PA-C  sucralfate (CARAFATE) 1 GM/10ML suspension Take 10 mLs (1 g total) by mouth 4 (four) times daily -  with  meals and at bedtime. Patient not taking: Reported on 12/08/2014 09/30/14   Antony Madura, PA-C  traZODone (DESYREL) 50 MG tablet Take 1 tablet (50 mg total) by mouth at bedtime and may repeat dose one time if needed. For sleep Patient not taking: Reported on 04/15/2015 06/12/14   Armandina Stammer I, NP     Critical care time: 

## 2024-01-02 NOTE — ED Notes (Signed)
 Only 1 unit of emergent blood given to pt. Hgb WNL.

## 2024-01-02 NOTE — Progress Notes (Signed)
 Pharmacy Electrolyte Replacement  Recent Labs:  Recent Labs    01/02/24 0600  K 3.8  MG 1.6*  PHOS 2.7  CREATININE 1.14    Low Critical Values (K </= 2.5, Phos </= 1, Mg </= 1) Present: None  Plan: Magnesium 2g IV x1 dose  Lynann Beaver PharmD, BCPS [facility] main pharmacy [phone] 01/02/2024 11:05 AM

## 2024-01-02 NOTE — Progress Notes (Signed)
 Initial Nutrition Assessment  DOCUMENTATION CODES:   Not applicable  INTERVENTION:  - Once able to start tube feeds, recommend: Vital 1.5 at 50 ml/h (1440 ml per day) *Would recommend starting at 40mL/hr and advancing by 10mL Q8H Prosource TF20 60 ml BID Provides 1960 kcal, 121 gm protein, 917 ml free water daily  - If starting tube feeds, monitor magnesium, potassium, and phosphorus BID for at least 3 days, MD to replete as needed, as pt may be at risk for refeeding syndrome.  - FWF per CCM/MD.    NUTRITION DIAGNOSIS:   Moderate Malnutrition related to chronic illness as evidenced by mild fat depletion, moderate muscle depletion.  GOAL:   Patient will meet greater than or equal to 90% of their needs  MONITOR:   Vent status, Labs, Weight trends  REASON FOR ASSESSMENT:   Ventilator    ASSESSMENT:   38 yo male found down with acute etoh intoxication.  3/5 Admit, Intubated  Patient is currently intubated on ventilator support MV: 11.2 L/min Temp (24hrs), Avg:96.8 F (36 C), Min:91.6 F (33.1 C), Max:99.9 F (37.7 C)  No family at bedside at time of visit.  OGT xray verified in the stomach. No plan for TF's today, will provide recs.    Medications reviewed and include: 1mg  folic acid, MIV, 100mg  thiamine Precedex Levophed @ 54mcg/min Propofol @ 12.72mL/hr (provides 323 kcals over 24 hours)  Labs reviewed:  Magneisum 1.6   NUTRITION - FOCUSED PHYSICAL EXAM:  Flowsheet Row Most Recent Value  Orbital Region Mild depletion  Upper Arm Region Moderate depletion  Thoracic and Lumbar Region Mild depletion  Buccal Region Unable to assess  Temple Region Moderate depletion  Clavicle Bone Region Mild depletion  Clavicle and Acromion Bone Region Mild depletion  Scapular Bone Region Unable to assess  Dorsal Hand Unable to assess  Patellar Region Moderate depletion  Anterior Thigh Region Moderate depletion  Posterior Calf Region Moderate depletion  Edema (RD  Assessment) None  Hair Reviewed  Eyes Unable to assess  Mouth Unable to assess  Skin Reviewed  Nails Reviewed       Diet Order:   Diet Order             Diet NPO time specified  Diet effective now                   EDUCATION NEEDS:  Not appropriate for education at this time  Skin:  Skin Assessment: Reviewed RN Assessment  Last BM:  PTA  Height:  Ht Readings from Last 1 Encounters:  01/02/24 5\' 10"  (1.778 m)   Weight:  Wt Readings from Last 1 Encounters:  01/02/24 64.7 kg    BMI:  Body mass index is 20.47 kg/m.  Estimated Nutritional Needs:  Kcal:  1950-2150 kcals Protein:  95-130 grams Fluid:  >/= 2L    Shelle Iron RD, LDN Contact via Secure Chat.

## 2024-01-02 NOTE — Progress Notes (Signed)
  Echocardiogram 2D Echocardiogram has been performed.  Devin Hudson 01/02/2024, 12:19 PM

## 2024-01-02 NOTE — ED Provider Notes (Signed)
 [place] EMERGENCY DEPARTMENT AT [facility] Provider Note   CSN: [id number] Arrival date & time: 01/02/24  0055     History  Chief Complaint  Patient presents with   Unresponsive    Devin Hudson is a 38 y.o. male.  Level 5 caveat for acuity of condition.  Patient presents via EMS unresponsive.  He was reportedly drinking alcohol all day with friends and found down at a friend's house.  He was pale and unresponsive.  No reported vomiting or diarrhea.  Not able to give any history.  Denies any drug use.  Hypoxic on nonrebreather and unresponsive.  No obvious trauma.  Patient appears very pale.  EMS denies any report of vomiting or diarrhea or trauma.  The history is provided by the EMS personnel. The history is limited by the condition of the patient.       Home Medications Prior to Admission medications   Medication Sig Start Date End Date Taking? Authorizing Provider  amoxicillin-clavulanate (AUGMENTIN) 875-125 MG tablet Take 1 tablet by mouth every 12 (twelve) hours. 09/26/23   Royanne Foots, DO  chlordiazePOXIDE (LIBRIUM) 25 MG capsule 50mg  PO TID x 1D, then 25-50mg  PO BID X 1D, then 25-50mg  PO QD X 1D 09/26/23   Royanne Foots, DO  chlorhexidine (PERIDEX) 0.12 % solution Use as directed 15 mLs in the mouth or throat 2 (two) times daily. 02/03/16   Fayrene Helper, PA-C  famotidine (PEPCID) 20 MG tablet Take 1 tablet (20 mg total) by mouth 2 (two) times daily. Patient not taking: Reported on 12/08/2014 09/30/14   Antony Madura, PA-C  guaiFENesin (ROBITUSSIN) 100 MG/5ML liquid Take 5-10 mLs (100-200 mg total) by mouth every 4 (four) hours as needed for congestion. 02/03/16   Fayrene Helper, PA-C  LORazepam (ATIVAN) 1 MG tablet Take 1 tablet (1 mg total) by mouth 3 (three) times daily as needed for anxiety (For anxiety and tremors). Patient not taking: Reported on 04/15/2015 09/30/14   Antony Madura, PA-C  sucralfate (CARAFATE) 1 GM/10ML suspension Take 10 mLs (1 g total) by  mouth 4 (four) times daily -  with meals and at bedtime. Patient not taking: Reported on 12/08/2014 09/30/14   Antony Madura, PA-C  traZODone (DESYREL) 50 MG tablet Take 1 tablet (50 mg total) by mouth at bedtime and may repeat dose one time if needed. For sleep Patient not taking: Reported on 04/15/2015 06/12/14   Armandina Stammer I, NP      Allergies    Other, Peanut butter flavoring agent (non-screening), and Peanut-containing drug products    Review of Systems   Review of Systems  Unable to perform ROS: Mental status change    Physical Exam Updated Vital Signs BP (!) 88/63   Pulse 98   Temp 99.6 F (37.6 C)   Resp 20   Ht 5\' 9"  (1.753 m)   Wt 68 kg   SpO2 93%   BMI 22.14 kg/m  Physical Exam Constitutional:      General: He is in acute distress.     Appearance: He is ill-appearing and toxic-appearing.     Comments: Unresponsive  HENT:     Head:     Comments: Very pale appearing pale conjunctiva, disconjugate gaze Cardiovascular:     Rate and Rhythm: Tachycardia present.  Pulmonary:     Comments: Agonal respirations breath sounds equal with bagging Abdominal:     Tenderness: There is no abdominal tenderness. There is no guarding or rebound.  Comments: Soft, nontender, nondistended  Genitourinary:    Comments: No gross blood on rectal exam, no melena Musculoskeletal:        General: Normal range of motion.  Skin:    Capillary Refill: Capillary refill takes more than 3 seconds.  Neurological:     Comments: Unresponsive, not following commands     ED Results / Procedures / Treatments   Labs (all labs ordered are listed, but only abnormal results are displayed) Labs Reviewed  LACTIC ACID, PLASMA - Abnormal; Notable for the following components:      Result Value   Lactic Acid, Venous >9.0 (*)    All other components within normal limits  LACTIC ACID, PLASMA - Abnormal; Notable for the following components:   Lactic Acid, Venous 8.9 (*)    All other components  within normal limits  CBC WITH DIFFERENTIAL/PLATELET - Abnormal; Notable for the following components:   Basophils Absolute 0.2 (*)    All other components within normal limits  COMPREHENSIVE METABOLIC PANEL - Abnormal; Notable for the following components:   CO2 16 (*)    Glucose, Bld 195 (*)    Creatinine, Ser 1.56 (*)    Calcium 7.3 (*)    Albumin 3.3 (*)    AST 122 (*)    ALT 58 (*)    Total Bilirubin 1.7 (*)    GFR, Estimated 59 (*)    Anion gap 19 (*)    All other components within normal limits  LIPASE, BLOOD - Abnormal; Notable for the following components:   Lipase 58 (*)    All other components within normal limits  ETHANOL - Abnormal; Notable for the following components:   Alcohol, Ethyl (B) 542 (*)    All other components within normal limits  ACETAMINOPHEN LEVEL - Abnormal; Notable for the following components:   Acetaminophen (Tylenol), Serum <10 (*)    All other components within normal limits  SALICYLATE LEVEL - Abnormal; Notable for the following components:   Salicylate Lvl <7.0 (*)    All other components within normal limits  URINALYSIS, ROUTINE W REFLEX MICROSCOPIC - Abnormal; Notable for the following components:   APPearance HAZY (*)    Hgb urine dipstick SMALL (*)    All other components within normal limits  BLOOD GAS, ARTERIAL - Abnormal; Notable for the following components:   pH, Arterial 7.3 (*)    pCO2 arterial 27 (*)    pO2, Arterial 420 (*)    Bicarbonate 13.8 (*)    Acid-base deficit 11.5 (*)    All other components within normal limits  CBG MONITORING, ED - Abnormal; Notable for the following components:   Glucose-Capillary 180 (*)    All other components within normal limits  CBG MONITORING, ED - Abnormal; Notable for the following components:   Glucose-Capillary 183 (*)    All other components within normal limits  I-STAT CHEM 8, ED - Abnormal; Notable for the following components:   Creatinine, Ser 1.90 (*)    Glucose, Bld 187 (*)     Calcium, Ion 0.87 (*)    TCO2 18 (*)    All other components within normal limits  TROPONIN I (HIGH SENSITIVITY) - Abnormal; Notable for the following components:   Troponin I (High Sensitivity) 456 (*)    All other components within normal limits  CULTURE, BLOOD (ROUTINE X 2)  CULTURE, BLOOD (ROUTINE X 2)  PROTIME-INR  AMMONIA  RAPID URINE DRUG SCREEN, HOSP PERFORMED  TSH  CBC  MAGNESIUM  PHOSPHORUS  COMPREHENSIVE METABOLIC PANEL  HEMOGLOBIN A1C  HIV ANTIBODY (ROUTINE TESTING W REFLEX)  LACTIC ACID, PLASMA  CK  POC OCCULT BLOOD, ED  POC OCCULT BLOOD, ED  TYPE AND SCREEN  PREPARE RBC (CROSSMATCH)  ABO/RH  TROPONIN I (HIGH SENSITIVITY)    EKG EKG Interpretation Date/Time:  Wednesday January 02 2024 01:02:15 EST Ventricular Rate:  119 PR Interval:  133 QRS Duration:  86 QT Interval:  352 QTC Calculation: 496 R Axis:   58  Text Interpretation: Sinus tachycardia Prolonged QT interval No previous ECGs available Confirmed by Glynn Octave [phone]) on 01/02/2024 5:15:53 AM  Radiology CT Cervical Spine Wo Contrast Result Date: 01/02/2024 CLINICAL DATA:  38 year old male found down, unresponsive. Intubated. EXAM: CT CERVICAL SPINE WITHOUT CONTRAST TECHNIQUE: Multidetector CT imaging of the cervical spine was performed without intravenous contrast. Multiplanar CT image reconstructions were also generated. RADIATION DOSE REDUCTION: This exam was performed according to the departmental dose-optimization program which includes automated exposure control, adjustment of the mA and/or kV according to patient size and/or use of iterative reconstruction technique. COMPARISON:  Head CT today.  Cervical spine CT 11/23/2008. FINDINGS: Alignment: Chronic straightening of cervical lordosis. Cervicothoracic junction alignment is within normal limits. Bilateral posterior element alignment is within normal limits. Skull base and vertebrae: Normal background bone mineralization. Visualized skull base  is intact. No atlanto-occipital dissociation. No acute osseous abnormality identified. Soft tissues and spinal canal: No prevertebral fluid or swelling. No visible canal hematoma. Intubated and oral enteric tube in place, both taken appropriate course. Small volume of fluid in the pharynx. Negative visible noncontrast neck soft tissues otherwise. Disc levels:  Negative. Upper chest: Visible upper thoracic levels appear intact. Upper lungs appear well aerated. Trace gas in the lower right internal jugular vein, probably IV access related. Other: Left mandible condyle chronic deformity with sclerosis and ORIF hardware. IMPRESSION: 1. No acute traumatic injury identified in the cervical spine. 2. Intubated and oral enteric tube with appropriate visible course. 3. 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 Electronically Signed   By: Odessa Fleming M.D.   On: 01/02/2024 05:08   CT Head Wo Contrast Result Date: 01/02/2024 CLINICAL DATA:  38 year old male found down, unresponsive. Intubated. EXAM: CT HEAD WITHOUT CONTRAST TECHNIQUE: Contiguous axial images were obtained from the base of the skull through the vertex without intravenous contrast. RADIATION DOSE REDUCTION: This exam was performed according to the departmental dose-optimization program which includes automated exposure control, adjustment of the mA and/or kV according to patient size and/or use of iterative reconstruction technique. COMPARISON:  Head CT 10/23/2012. FINDINGS: Brain: Cerebral volume remains normal. No midline shift, ventriculomegaly, mass effect, evidence of mass lesion, intracranial hemorrhage or evidence of cortically based acute infarction. Gray-white matter differentiation is within normal limits throughout the brain. Vascular: No suspicious intracranial vascular hyperdensity. Skull: Chronic posttraumatic changes and post ORIF partially visible to the left mandible. Asymmetric TMJ alignment there associated. No acute skull fracture or No  acute osseous abnormality identified. Sinuses/Orbits: Intubated on the scout view. Retained secretions in the visible pharynx. Bilateral ethmoid and maxillary sinus secretions are relatively mild. Other Visualized paranasal sinuses and mastoids are stable and well aerated. Other: Visualized orbits and scalp soft tissues are within normal limits. IMPRESSION: 1. Stable and normal noncontrast CT appearance of the brain. 2. Intubated with retained secretions in the pharynx and paranasal sinuses. 3. Chronic appearing posttraumatic and post ORIF changes to the left mandible ramus. Electronically Signed   By: Odessa Fleming M.D.   On: 01/02/2024  05:04   DG Abd Portable 1 View Result Date: 01/02/2024 CLINICAL DATA:  OG tube placement EXAM: PORTABLE ABDOMEN - 1 VIEW COMPARISON:  None Available. FINDINGS: Enteric tube tip and side port overlie the proximal stomach. Upper gas pattern is normal IMPRESSION: Enteric tube tip and side port overlie the proximal stomach Electronically Signed   By: Jasmine Pang M.D.   On: 01/02/2024 01:44   DG Chest Portable 1 View Result Date: 01/02/2024 CLINICAL DATA:  Intubated EXAM: PORTABLE CHEST 1 VIEW COMPARISON:  11/29/2010 FINDINGS: Endotracheal tube tip about 1.7 cm superior to carina. Enteric tube tip in the GE junction region. Normal cardiac size. No acute airspace disease or pneumothorax IMPRESSION: 1. Endotracheal tube tip about 1.7 cm superior to carina. 2. Clear lung fields Electronically Signed   By: Jasmine Pang M.D.   On: 01/02/2024 01:44    Procedures .Critical Care  Performed by: Glynn Octave, MD Authorized by: Glynn Octave, MD   Critical care provider statement:    Critical care time (minutes):  90   Critical care time was exclusive of:  Separately billable procedures and treating other patients   Critical care was necessary to treat or prevent imminent or life-threatening deterioration of the following conditions:  Respiratory failure and shock   Critical  care was time spent personally by me on the following activities:  Development of treatment plan with patient or surrogate, discussions with consultants, evaluation of patient's response to treatment, examination of patient, ordering and review of laboratory studies, ordering and review of radiographic studies, ordering and performing treatments and interventions, pulse oximetry, re-evaluation of patient's condition, review of old charts, blood draw for specimens and obtaining history from patient or surrogate   I assumed direction of critical care for this patient from another provider in my specialty: no     Care discussed with: admitting provider   Procedure Name: Intubation Date/Time: 01/02/2024 1:29 AM  Performed by: Glynn Octave, MDPre-anesthesia Checklist: Patient identified, Patient being monitored, Emergency Drugs available, Timeout performed and Suction available Oxygen Delivery Method: Non-rebreather mask Preoxygenation: Pre-oxygenation with 100% oxygen Induction Type: Rapid sequence Ventilation: Mask ventilation without difficulty Laryngoscope Size: Glidescope and 4 Grade View: Grade I Tube size: 7.5 mm Number of attempts: 1 Airway Equipment and Method: Video-laryngoscopy and Patient positioned with wedge pillow Placement Confirmation: ETT inserted through vocal cords under direct vision, CO2 detector, Breath sounds checked- equal and bilateral and Positive ETCO2 Secured at: 25 cm Tube secured with: ETT holder Dental Injury: Teeth and Oropharynx as per pre-operative assessment  Difficulty Due To: Difficult Airway- due to limited oral opening and Difficulty was anticipated Future Recommendations: Recommend- induction with short-acting agent, and alternative techniques readily available        Medications Ordered in ED Medications  0.9 %  sodium chloride infusion (Manually program via Guardrails IV Fluids) (has no administration in time range)  propofol (DIPRIVAN) 1000  MG/100ML infusion (has no administration in time range)  0.9 %  sodium chloride infusion (Manually program via Guardrails IV Fluids) (has no administration in time range)  fentaNYL (SUBLIMAZE) injection 50 mcg (has no administration in time range)  fentaNYL (SUBLIMAZE) injection 50-200 mcg (has no administration in time range)  propofol (DIPRIVAN) 1000 MG/100ML infusion (has no administration in time range)    ED Course/ Medical Decision Making/ A&P  Medical Decision Making Amount and/or Complexity of Data Reviewed Independent Historian: EMS Labs: ordered. Decision-making details documented in ED Course. Radiology: ordered and independent interpretation performed. Decision-making details documented in ED Course. ECG/medicine tests: ordered and independent interpretation performed. Decision-making details documented in ED Course.  Risk Prescription drug management. Decision regarding hospitalization.   Patient arrives unresponsive in extremis with hypotension, pale appearance, agonal respirations.  Reportedly drinking alcohol all day without any of additional drug use.  Concern for occult GI bleed given his pale appearance. Glucose is normal.  No response to Narcan. Given emergency release blood, IV fluids and peripheral vasopressors to ensure adequate blood pressure for rapid sequence intubation.  Patient performed on arrival to protect airway.  Remains hypotensive and pale.  Emergency release blood is given.  hemoglobin has come back at 15.6.  Creatinine 1.9.  Patient is now hypothermic at 93, hypotensive at the 80s.  Hypoxic on 100% FiO2.  Recruitment maneuver was performed with respiratory therapy at bedside.  Chest x-ray shows no significant infiltrate or edema. Bear hugger placed.  Labs remarkable for significant lactic acidosis of lactate greater than 9. Alcohol 549  Given his hypothermia, hypotension and hypoxia, treated empirically for sepsis.   Given broad-spectrum antibiotics and IV fluids after cultures are obtained.  Patient taken to CT scan. CT head negative for hemorrhage.  CT C-spine is negative. No Obvious pneumonia.  Urinalysis is pending.  Blood pressure improving after IV fluids.  Levophed ordered but not yet begun.  Patient to be admitted to the ICU.  Discussed with Dr. Gaynell Face.  Family updated at bedside.       Final Clinical Impression(s) / ED Diagnoses Final diagnoses:  Acute respiratory failure with hypoxia (HCC)  Alcoholic intoxication without complication (HCC)  Lactic acidosis    Rx / DC Orders ED Discharge Orders     None         Leonte Horrigan, Jeannett Senior, MD 01/02/24 [phone]

## 2024-01-02 NOTE — ED Triage Notes (Signed)
 Pt to ED via [facility] from friend's house, per EMS drinking alcohol tonight and found down by friend.  Pt presents pale, unresponsive to painful stimuli, 88% NRB, hypotensive, and given approx NS en route.  Pt taken to res B on arrival, EDP at bedside, pt still unresponsive and decision to intubate made.

## 2024-01-03 DIAGNOSIS — G928 Other toxic encephalopathy: Secondary | ICD-10-CM | POA: Diagnosis not present

## 2024-01-03 DIAGNOSIS — E876 Hypokalemia: Secondary | ICD-10-CM

## 2024-01-03 DIAGNOSIS — E872 Acidosis, unspecified: Secondary | ICD-10-CM | POA: Diagnosis not present

## 2024-01-03 LAB — CBC
HCT: 40.5 % (ref 39.0–52.0)
Hemoglobin: 13.6 g/dL (ref 13.0–17.0)
MCH: 32.1 pg (ref 26.0–34.0)
MCHC: 33.6 g/dL (ref 30.0–36.0)
MCV: 95.5 fL (ref 80.0–100.0)
Platelets: 137 10*3/uL — ABNORMAL LOW (ref 150–400)
RBC: 4.24 MIL/uL (ref 4.22–5.81)
RDW: 14.1 % (ref 11.5–15.5)
WBC: 8.8 10*3/uL (ref 4.0–10.5)
nRBC: 0 % (ref 0.0–0.2)

## 2024-01-03 LAB — TYPE AND SCREEN
ABO/RH(D): A POS
Antibody Screen: NEGATIVE
Unit division: 0
Unit division: 0

## 2024-01-03 LAB — GLUCOSE, CAPILLARY
Glucose-Capillary: 121 mg/dL — ABNORMAL HIGH (ref 70–99)
Glucose-Capillary: 153 mg/dL — ABNORMAL HIGH (ref 70–99)
Glucose-Capillary: 81 mg/dL (ref 70–99)
Glucose-Capillary: 88 mg/dL (ref 70–99)
Glucose-Capillary: 91 mg/dL (ref 70–99)
Glucose-Capillary: 95 mg/dL (ref 70–99)

## 2024-01-03 LAB — COMPREHENSIVE METABOLIC PANEL
ALT: 66 U/L — ABNORMAL HIGH (ref 0–44)
AST: 213 U/L — ABNORMAL HIGH (ref 15–41)
Albumin: 2.4 g/dL — ABNORMAL LOW (ref 3.5–5.0)
Alkaline Phosphatase: 60 U/L (ref 38–126)
Anion gap: 13 (ref 5–15)
BUN: 12 mg/dL (ref 6–20)
CO2: 19 mmol/L — ABNORMAL LOW (ref 22–32)
Calcium: 7.9 mg/dL — ABNORMAL LOW (ref 8.9–10.3)
Chloride: 106 mmol/L (ref 98–111)
Creatinine, Ser: 0.77 mg/dL (ref 0.61–1.24)
GFR, Estimated: >60 mL/min (ref >60)
Glucose, Bld: 85 mg/dL (ref 70–99)
Potassium: 3.2 mmol/L — ABNORMAL LOW (ref 3.5–5.1)
Sodium: 138 mmol/L (ref 135–145)
Total Bilirubin: 3.1 mg/dL — ABNORMAL HIGH (ref 0.0–1.2)
Total Protein: 5.1 g/dL — ABNORMAL LOW (ref 6.5–8.1)

## 2024-01-03 LAB — TRIGLYCERIDES: Triglycerides: 294 mg/dL — ABNORMAL HIGH (ref <150)

## 2024-01-03 LAB — BPAM RBC
Blood Product Expiration Date: 202504112359
Blood Product Expiration Date: 202504112359
ISSUE DATE / TIME: 202503050110
ISSUE DATE / TIME: 202503050110
Unit Type and Rh: 9500
Unit Type and Rh: 9500

## 2024-01-03 LAB — LIPASE, BLOOD: Lipase: 37 U/L (ref 11–51)

## 2024-01-03 LAB — MAGNESIUM: Magnesium: 1.8 mg/dL (ref 1.7–2.4)

## 2024-01-03 MED ORDER — POTASSIUM CHLORIDE 20 MEQ PO PACK
40.0000 meq | PACK | Freq: Once | ORAL | Status: AC
  Administered 2024-01-03: 40 meq
  Filled 2024-01-03: qty 2

## 2024-01-03 MED ORDER — HYDROXYZINE HCL 25 MG PO TABS: 25.0000 mg | ORAL_TABLET | Freq: Four times a day (QID) | ORAL | Status: AC | PRN

## 2024-01-03 MED ORDER — LORAZEPAM 1 MG PO TABS
1.0000 mg | ORAL_TABLET | ORAL | Status: AC | PRN
  Administered 2024-01-05: 2 mg via ORAL
  Administered 2024-01-05: 1 mg via ORAL
  Administered 2024-01-05: 2 mg via ORAL
  Administered 2024-01-05: 1 mg via ORAL
  Administered 2024-01-06 (×2): 2 mg via ORAL
  Filled 2024-01-03 (×2): qty 2
  Filled 2024-01-03: qty 1
  Filled 2024-01-03: qty 2
  Filled 2024-01-03: qty 1
  Filled 2024-01-03: qty 2

## 2024-01-03 MED ORDER — LORAZEPAM 2 MG/ML IJ SOLN
0.0000 mg | Freq: Three times a day (TID) | INTRAMUSCULAR | Status: DC
  Administered 2024-01-05: 1 mg via INTRAVENOUS
  Filled 2024-01-03: qty 1

## 2024-01-03 MED ORDER — POLYETHYLENE GLYCOL 3350 17 G PO PACK: 17.0000 g | PACK | Freq: Every day | ORAL | Status: DC | PRN

## 2024-01-03 MED ORDER — LOPERAMIDE HCL 1 MG/7.5ML PO SUSP: 2.0000 mg | ORAL | Status: AC | PRN

## 2024-01-03 MED ORDER — MAGNESIUM SULFATE 2 GM/50ML IV SOLN
2.0000 g | Freq: Once | INTRAVENOUS | Status: AC
  Administered 2024-01-03: 2 g via INTRAVENOUS
  Filled 2024-01-03: qty 50

## 2024-01-03 MED ORDER — CHLORDIAZEPOXIDE HCL 25 MG PO CAPS: 25.0000 mg | ORAL_CAPSULE | Freq: Three times a day (TID) | ORAL | Status: DC

## 2024-01-03 MED ORDER — ADULT MULTIVITAMIN W/MINERALS CH
1.0000 | ORAL_TABLET | Freq: Every day | ORAL | Status: DC
Start: 2024-01-04 — End: 2024-01-07
  Administered 2024-01-04 – 2024-01-07 (×4): 1 via ORAL
  Filled 2024-01-03 (×4): qty 1

## 2024-01-03 MED ORDER — THIAMINE MONONITRATE 100 MG PO TABS
100.0000 mg | ORAL_TABLET | Freq: Every day | ORAL | Status: DC
  Administered 2024-01-04 – 2024-01-07 (×4): 100 mg via ORAL
  Filled 2024-01-03 (×4): qty 1

## 2024-01-03 MED ORDER — LORAZEPAM 2 MG/ML IJ SOLN
1.0000 mg | INTRAMUSCULAR | Status: DC | PRN
  Administered 2024-01-03 – 2024-01-04 (×2): 1 mg via INTRAVENOUS
  Administered 2024-01-04 – 2024-01-05 (×3): 2 mg via INTRAVENOUS
  Administered 2024-01-05: 1 mg via INTRAVENOUS
  Filled 2024-01-03 (×4): qty 1

## 2024-01-03 MED ORDER — CHLORDIAZEPOXIDE HCL 25 MG PO CAPS: 25.0000 mg | ORAL_CAPSULE | Freq: Every day | ORAL | Status: DC

## 2024-01-03 MED ORDER — FOLIC ACID 1 MG PO TABS
1.0000 mg | ORAL_TABLET | Freq: Every day | ORAL | Status: DC
  Administered 2024-01-04 – 2024-01-07 (×4): 1 mg via ORAL
  Filled 2024-01-03 (×4): qty 1

## 2024-01-03 MED ORDER — CHLORDIAZEPOXIDE HCL 25 MG PO CAPS: 25.0000 mg | ORAL_CAPSULE | Freq: Four times a day (QID) | ORAL | Status: DC | PRN

## 2024-01-03 MED ORDER — CHLORDIAZEPOXIDE HCL 25 MG PO CAPS
25.0000 mg | ORAL_CAPSULE | ORAL | Status: AC
  Administered 2024-01-04 (×2): 25 mg via ORAL
  Filled 2024-01-03 (×2): qty 1

## 2024-01-03 MED ORDER — LORAZEPAM 2 MG/ML IJ SOLN
0.0000 mg | INTRAMUSCULAR | Status: DC
  Administered 2024-01-03: 2 mg via INTRAVENOUS
  Administered 2024-01-03: 1 mg via INTRAVENOUS
  Administered 2024-01-04: 2 mg via INTRAVENOUS
  Administered 2024-01-04: 1 mg via INTRAVENOUS
  Administered 2024-01-04: 2 mg via INTRAVENOUS
  Filled 2024-01-03 (×6): qty 1
  Filled 2024-01-03: qty 2
  Filled 2024-01-03 (×2): qty 1

## 2024-01-03 MED ORDER — CHLORDIAZEPOXIDE HCL 25 MG PO CAPS
25.0000 mg | ORAL_CAPSULE | Freq: Every day | ORAL | Status: AC
  Administered 2024-01-05: 25 mg via ORAL
  Filled 2024-01-03: qty 1

## 2024-01-03 MED ORDER — CHLORDIAZEPOXIDE HCL 25 MG PO CAPS: 25.0000 mg | ORAL_CAPSULE | ORAL | Status: DC

## 2024-01-03 MED ORDER — CHLORDIAZEPOXIDE HCL 25 MG PO CAPS
25.0000 mg | ORAL_CAPSULE | Freq: Three times a day (TID) | ORAL | Status: AC
  Administered 2024-01-03 (×2): 25 mg via ORAL
  Filled 2024-01-03 (×2): qty 1

## 2024-01-03 NOTE — Plan of Care (Signed)
  Problem: Education: Goal: Knowledge of General Education information will improve Description: Including pain rating scale, medication(s)/side effects and non-pharmacologic comfort measures Outcome: Progressing   Problem: Clinical Measurements: Goal: Ability to maintain clinical measurements within normal limits will improve Outcome: Progressing Goal: Respiratory complications will improve Outcome: Progressing   Problem: Nutrition: Goal: Adequate nutrition will be maintained Outcome: Progressing   Problem: Pain Managment: Goal: General experience of comfort will improve and/or be controlled Outcome: Progressing

## 2024-01-03 NOTE — Progress Notes (Addendum)
 NAME:  Devin Hudson, MRN:  [id number], DOB:  [birth year], LOS: 1 ADMISSION DATE:  01/02/2024, CONSULTATION DATE:  01/02/24 REFERRING MD:  EDP, CHIEF COMPLAINT:  acute hypoxic resp failure   History of Present Illness:  38 yo male found down with acute etoh intoxication. Unable to provide history, hypoxic and appeared pale at presentation given emergent unit of blood prior to lab return. EMS states no indication of trauma.  On eval in ed he was emergently intubated has not started vasopressors.  ETOH 542, UDS neg, tylenol/ salicylates neg.   Pertinent  Medical History  ETOH abuse   Significant Hospital Events: Including procedures, antibiotic start and stop dates in addition to other pertinent events   Admitted to ICU 3/5, intubated.   Interim History / Subjective:  Off propofol and fentanyl, doing well on SBT, wake, f/c, mildly agitated but redirectable, but still having intermittent apneic/ bradypnea> dex stopped.   Remains afebrile, hemodynamically stable otherwise.   Mother at bedside voices concern over recurrent rehab attempts but pt continues to sneak out and drink.  Possibly took "rush popper"> a recreational drug belonging to nitrite family, creates psychoactive high, intended to be inhaled/ sniffed but suspected he drank  Objective   Blood pressure (!) 155/101, pulse 69, temperature 99.9 F (37.7 C), resp. rate (!) 22, height 5\' 10"  (1.778 m), weight 68.3 kg, SpO2 100%.    Vent Mode: PSV;CPAP FiO2 (%):  [30 %-40 %] 30 % Set Rate:  [20 bmp] 20 bmp Vt Set:  [580 mL] 580 mL PEEP:  [5 cmH20] 5 cmH20 Pressure Support:  [5 cmH20] 5 cmH20 Plateau Pressure:  [14 cmH20-16 cmH20] 16 cmH20   Intake/Output Summary (Last 24 hours) at 01/03/2024 1014 Last data filed at 01/03/2024 1000 Gross per 24 hour  Intake 3511.98 ml  Output 1250 ml  Net 2261.98 ml   Filed Weights   01/02/24 0114 01/02/24 0709 01/03/24 0432  Weight: 68 kg 64.7 kg 68.3 kg   Examination: General:  Young adult  male sitting in bed, intermittently agitated but redirectable, intermittently drowsy with rr 7-8 at times without desaturation, great TVs, easily awakens, improving since sedation stopped HEENT: MM pink/moist, ETT/ OGT, pupils 3/r, mild scleral injection, anicteric Neuro: Awake, f/c, MAE, non focal CV: rr, NSR, no murmur PULM:  non labored on SBT, clear GI: soft, bs+, NT/ ND, foley- cyu Extremities: warm/dry, LUE/ forearm with edema, one small blister, has L AC PIV which flushes well/ has good blood return per RN, no tibal edema  Skin: slight maculopapular rash to upper chest area, few on forehead, denies itching   Afebrile UOP 1.2L/ 24hrs Net +2.2L Labs reviewed> K3.2 (repleted), sCr 1.14> 0.77, AST/ ALT 105/ 53> 213/ 66, t.bili 2.1> 3.1, trop hs 456> 820> 593, CBC wnl except plt 137 (196)  RUQ [place] 3/5 > neg, portal vein patent, CBD normal parameters TTE 3/5>  [facility] neg  Resolved Hospital Problem list     Assessment & Plan:  Acute alcohol intoxication/ ETOH abuse and possible other substance abuse not detected on UDS Toxic/ metabolic encephalopathy Acute hypoxic resp failure Lactic acidosis, improving AKI, resolved Hyperglycemia, suspect reactive, improved Transaminitis Hypokalemia P:  - awake and tolerating SBT> plans to extubate this am - NPO following extubation, advance diet if stable after 4 hours - d/c PAD protocol.  Cont with librium taper and add ativan CIWA in attempts to not restart precedex - monitor QTC, optimize electrolytes  - cont thiamine/ folate/ MVI - RUQ wnl.  Check  hepatitis panel given elevated LFTs and cont to trend - stop MIVF, bolus if needed - d/c foley, monitor for retention - suspect trop secondary to demand as echo normal without RWMA, EF 50-55 - delirium precautions - serial neuro exams, cont neuro protective measures - unclear etiology of rash at this time, non pruritic.  WBC/ afebrile.  Cont to monitor clinically/ BC - will need substance abuse  counseling, likely get input from psych team when applicable for possible given recurrent substance abuse issues per family   Best Practice (right click and "Reselect all SmartList Selections" daily)   Diet/type: NPO DVT prophylaxis SCD Pressure ulcer(s): N/A GI prophylaxis: N/A and H2B Lines: N/A Foley:  Yes, and it is no longer needed and removal ordered  Code Status:  full code Last date of multidisciplinary goals of care discussion [pedning d./w family as well]  Mother updated per Dr. Francine Graven  Labs   CBC: Recent Labs  Lab 01/02/24 0115 01/02/24 0124 01/02/24 0600 01/03/24 0307  WBC 7.4  --  10.6* 8.8  NEUTROABS 3.8  --   --   --   HGB 14.9 15.6 13.8 13.6  HCT 45.0 46.0 40.2 40.5  MCV 99.6  --  96.2 95.5  PLT 243  --  196 137*    Basic Metabolic Panel: Recent Labs  Lab 01/02/24 0115 01/02/24 0124 01/02/24 0600 01/03/24 0307  NA 141 142 137 138  K 3.7 3.8 3.8 3.2*  CL 106 108 105 106  CO2 16*  --  18* 19*  GLUCOSE 195* 187* 102* 85  BUN 16 14 17 12   CREATININE 1.56* 1.90* 1.14 0.77  CALCIUM 7.3*  --  6.8* 7.9*  MG  --   --  1.6* 1.8  PHOS  --   --  2.7  --    GFR: Estimated Creatinine Clearance: 124.5 mL/min (by C-G formula based on SCr of 0.77 mg/dL). Recent Labs  Lab 01/02/24 0115 01/02/24 0340 01/02/24 0600 01/02/24 1232 01/02/24 1550 01/03/24 0307  WBC 7.4  --  10.6*  --   --  8.8  LATICACIDVEN >9.0* 8.9* 5.8* 3.5* 3.2*  --     Liver Function Tests: Recent Labs  Lab 01/02/24 0115 01/02/24 0600 01/03/24 0307  AST 122* 105* 213*  ALT 58* 53* 66*  ALKPHOS 70 55 60  BILITOT 1.7* 2.1* 3.1*  PROT 6.6 5.4* 5.1*  ALBUMIN 3.3* 2.7* 2.4*   Recent Labs  Lab 01/02/24 0115 01/03/24 0307  LIPASE 58* 37   Recent Labs  Lab 01/02/24 0115  AMMONIA 22    ABG    Component Value Date/Time   PHART 7.3 (L) 01/02/2024 0245   PCO2ART 27 (L) 01/02/2024 0245   PO2ART 420 (H) 01/02/2024 0245   HCO3 13.8 (L) 01/02/2024 0245   TCO2 18 (L)  01/02/2024 0124   ACIDBASEDEF 11.5 (H) 01/02/2024 0245   O2SAT 91.3 01/02/2024 0245     Coagulation Profile: Recent Labs  Lab 01/02/24 0115  INR 1.2    Cardiac Enzymes: Recent Labs  Lab 01/02/24 0600  CKTOTAL 386    HbA1C: Hgb A1c MFr Bld  Date/Time Value Ref Range Status  01/02/2024 06:00 AM 5.7 (H) 4.8 - 5.6 % Final    Comment:    (NOTE) Pre diabetes:          5.7%-6.4%  Diabetes:              >6.4%  Glycemic control for   <7.0% adults with diabetes  06/11/2014 06:15 AM 5.5 <5.7 % Final    Comment:    (NOTE)                                                                       According to the ADA Clinical Practice Recommendations for 2011, when HbA1c is used as a screening test:  >=6.5%   Diagnostic of Diabetes Mellitus           (if abnormal result is confirmed) 5.7-6.4%   Increased risk of developing Diabetes Mellitus References:Diagnosis and Classification of Diabetes Mellitus,Diabetes Care,2011,34(Suppl 1):S62-S69 and Standards of Medical Care in         Diabetes - 2011,Diabetes Care,2011,34 (Suppl 1):S11-S61.    CBG: Recent Labs  Lab 01/02/24 1536 01/02/24 1948 01/02/24 2300 01/03/24 0339 01/03/24 0724  GLUCAP 117* 99 85 88 95    Allergies Allergies  Allergen Reactions   Other Other (See Comments)    Bologna allergy, throat swelling at child.    Peanut Butter Flavoring Agent (Non-Screening) Anaphylaxis and Hives   Peanut-Containing Drug Products Anaphylaxis and Hives     Home Medications  Prior to Admission medications   Medication Sig Start Date End Date Taking? Authorizing Provider  amoxicillin-clavulanate (AUGMENTIN) 875-125 MG tablet Take 1 tablet by mouth every 12 (twelve) hours. 09/26/23   Royanne Foots, DO  chlordiazePOXIDE (LIBRIUM) 25 MG capsule 50mg  PO TID x 1D, then 25-50mg  PO BID X 1D, then 25-50mg  PO QD X 1D 09/26/23   Royanne Foots, DO  chlorhexidine (PERIDEX) 0.12 % solution Use as directed 15 mLs in the mouth or  throat 2 (two) times daily. 02/03/16   Fayrene Helper, PA-C  famotidine (PEPCID) 20 MG tablet Take 1 tablet (20 mg total) by mouth 2 (two) times daily. Patient not taking: Reported on 12/08/2014 09/30/14   Antony Madura, PA-C  guaiFENesin (ROBITUSSIN) 100 MG/5ML liquid Take 5-10 mLs (100-200 mg total) by mouth every 4 (four) hours as needed for congestion. 02/03/16   Fayrene Helper, PA-C  LORazepam (ATIVAN) 1 MG tablet Take 1 tablet (1 mg total) by mouth 3 (three) times daily as needed for anxiety (For anxiety and tremors). Patient not taking: Reported on 04/15/2015 09/30/14   Antony Madura, PA-C  sucralfate (CARAFATE) 1 GM/10ML suspension Take 10 mLs (1 g total) by mouth 4 (four) times daily -  with meals and at bedtime. Patient not taking: Reported on 12/08/2014 09/30/14   Antony Madura, PA-C  traZODone (DESYREL) 50 MG tablet Take 1 tablet (50 mg total) by mouth at bedtime and may repeat dose one time if needed. For sleep Patient not taking: Reported on 04/15/2015 06/12/14   Sanjuana Kava, NP     Critical care time: 35 min       Posey Boyer, MSN, AG-ACNP-BC [facility] 01/03/2024, 10:14 AM  See Amion for pager If no response to pager , please call [phone] until 7pm After 7:00 pm call Elink  [phone]

## 2024-01-03 NOTE — Procedures (Signed)
 Extubation Procedure Note  Patient Details:   Name: Devin Hudson DOB: Apr 15, 1986 MRN: [id number]   Airway Documentation:    Vent end date: 01/03/24 Vent end time: 1020   Evaluation  O2 sats: stable throughout Complications: No apparent complications Patient did tolerate procedure well. Bilateral Breath Sounds: Clear, Diminished   Yes  Dairl Ponder Nannette 01/03/2024, 10:28 AM  Positive cuff leak pre extubation. Placed PT on 2 LPM post extubation. RN aware.

## 2024-01-03 NOTE — Progress Notes (Signed)
 eLink Physician-Brief Progress Note Patient Name: Devin Hudson DOB: [birth year] MRN: [id number]   Date of Service  01/03/2024  HPI/Events of Note  Potassium 3.2  eICU Interventions  Replacement ordered     Intervention Category Intermediate Interventions: Electrolyte abnormality - evaluation and management  Henry Russel, P 01/03/2024, 5:03 AM

## 2024-01-03 NOTE — Progress Notes (Signed)
 [facility] ADULT ICU REPLACEMENT PROTOCOL   The patient does apply for the [facility] Adult ICU Electrolyte Replacment Protocol based on the criteria listed below:   1.Exclusion criteria: TCTS, ECMO, Dialysis, and Myasthenia Gravis patients 2. Is GFR >/= 30 ml/min? Yes.    Patient's GFR today is >60 3. Is SCr </= 2? Yes.   Patient's SCr is 0.77 mg/dL 4. Did SCr increase >/= 0.5 in 24 hours? No. 5.Pt's weight >40kg  Yes.   6. Abnormal electrolyte(s): Mag 1.8  7. Electrolytes replaced per protocol 8.  Call MD STAT for K+ </= 2.5, Phos </= 1, or Mag </= 1 Physician:  Marinus Maw D Ouedraogo 01/03/2024 5:20 AM

## 2024-01-03 NOTE — TOC Progression Note (Signed)
 Transition of Care [facility]) - Progression Note    Patient Details  Name: Devin Hudson MRN: [id number] Date of Birth: 05/28/1986  Transition of Care [facility]) CM/SW Contact  Otelia Santee, LCSW Phone Number: 01/03/2024, 2:49 PM  Clinical Narrative:    Met with pt at bedside to provide substance use education, counseling and resources. Pt reports he has been drinking alcohol on a daily basis. Pt reports drinking 4-5 beers a day. Pt reports having a 5 year period of sobriety but, is unsure of how long ago this was. Pt states that staying buy helped him to maintain sobriety. Pt reports starting to drink alcohol again following a relationship. Pt states he is motivated for treatment. PT is more interested in receiving outpatient services vs inpatient rehab however, was agreeable to receive resources for both. Resources provided to pt and also placed on discharge instructions. Pt is agreeable to CSW to reach out to family to also discuss resources.  Spoke with pt's mother over the phone who shared she was currently at an appointment and agreed to meet in pt's room once she returned from appointment.         Expected Discharge Plan and Services                                               Social Determinants of Health (SDOH) Interventions SDOH Screenings   Social Connections: Unknown (03/14/2022)   Received from [facility]  Tobacco Use: High Risk (01/02/2024)    Readmission Risk Interventions    01/02/2024    3:26 PM  Readmission Risk Prevention Plan  Transportation Screening Complete  PCP or Specialist Appt within 5-7 Days Complete  Home Care Screening Complete  Medication Review (RN CM) Complete

## 2024-01-04 DIAGNOSIS — J9601 Acute respiratory failure with hypoxia: Secondary | ICD-10-CM | POA: Diagnosis not present

## 2024-01-04 LAB — GLUCOSE, CAPILLARY
Glucose-Capillary: 113 mg/dL — ABNORMAL HIGH (ref 70–99)
Glucose-Capillary: 95 mg/dL (ref 70–99)
Glucose-Capillary: 153 mg/dL — ABNORMAL HIGH (ref 70–99)

## 2024-01-04 LAB — BASIC METABOLIC PANEL
Anion gap: 8 (ref 5–15)
BUN: 9 mg/dL (ref 6–20)
CO2: 21 mmol/L — ABNORMAL LOW (ref 22–32)
Calcium: 7.7 mg/dL — ABNORMAL LOW (ref 8.9–10.3)
Chloride: 100 mmol/L (ref 98–111)
Creatinine, Ser: 0.9 mg/dL (ref 0.61–1.24)
GFR, Estimated: >60 mL/min (ref >60)
Glucose, Bld: 104 mg/dL — ABNORMAL HIGH (ref 70–99)
Potassium: 3.5 mmol/L (ref 3.5–5.1)
Sodium: 129 mmol/L — ABNORMAL LOW (ref 135–145)

## 2024-01-04 LAB — MAGNESIUM: Magnesium: 1.9 mg/dL (ref 1.7–2.4)

## 2024-01-04 LAB — HEPATITIS PANEL, ACUTE
HCV Ab: NONREACTIVE
Hep A IgM: NONREACTIVE
Hep B C IgM: NONREACTIVE
Hepatitis B Surface Ag: REACTIVE — AB

## 2024-01-04 MED ORDER — ENSURE ENLIVE PO LIQD
237.0000 mL | Freq: Two times a day (BID) | ORAL | Status: DC
  Administered 2024-01-04 – 2024-01-07 (×7): 237 mL via ORAL

## 2024-01-04 MED ORDER — PHENOL 1.4 % MT LIQD
1.0000 | OROMUCOSAL | Status: DC | PRN
  Administered 2024-01-04: 1 via OROMUCOSAL
  Filled 2024-01-04: qty 177

## 2024-01-04 MED ORDER — ACETAMINOPHEN 325 MG PO TABS
650.0000 mg | ORAL_TABLET | Freq: Four times a day (QID) | ORAL | Status: DC | PRN
  Administered 2024-01-04 – 2024-01-07 (×3): 650 mg via ORAL
  Filled 2024-01-04 (×4): qty 2

## 2024-01-04 NOTE — Evaluation (Signed)
 Physical Therapy Evaluation Patient Details Name: Devin Hudson MRN: [id number] DOB: [birth year] Today's Date: 01/04/2024  History of Present Illness  Pt is 38 yo male admitted on 01/02/24 with acute hypoxic resp failure due to alcohol intoxication.  Also with acute toxic encephalopathy.  Pt with ETT 3/5-01/02/25.  Pt with hx of EtOH abuse, polysubstance abuse, with EtOH W/D seizures.  Clinical Impression  Pt admitted with above diagnosis. At baseline, pt is independent.  Today, pt lethargic (suspect due to medication) and with flat affect.  He was able to perform bed mobility and transfers at [facility] level but min A for gait due to unsteadiness/drifting R/L in hallway.  Denied dizziness, VSS.  Did have decreased coordination bil UE and LE.   Pt currently with functional limitations due to the deficits listed below (see PT Problem List). Pt will benefit from acute skilled PT to increase their independence and safety with mobility to allow discharge.  At this time recommended HHPT and RW and initial supervision; however, pt with potential to progress to no needs.          If plan is discharge home, recommend the following: A little help with walking and/or transfers;A little help with bathing/dressing/bathroom;Assistance with cooking/housework;Help with stairs or ramp for entrance   Can travel by private vehicle        Equipment Recommendations Rolling walker (2 wheels)  Recommendations for Other Services       Functional Status Assessment Patient has had a recent decline in their functional status and demonstrates the ability to make significant improvements in function in a reasonable and predictable amount of time.     Precautions / Restrictions Precautions Precautions: Fall      Mobility  Bed Mobility Overal bed mobility: Needs Assistance Bed Mobility: Supine to Sit, Sit to Supine     Supine to sit: Contact guard          Transfers Overall transfer level: Needs  assistance Equipment used: None Transfers: Sit to/from Stand Sit to Stand: Contact guard assist           General transfer comment: CGA to steady    Ambulation/Gait Ambulation/Gait assistance: Min assist Gait Distance (Feet): 200 Feet Assistive device: None Gait Pattern/deviations: Step-through pattern, Drifts right/left, Narrow base of support Gait velocity: decreased     General Gait Details: Ambulated without AD, pt drifting R/L at times needing occasional min A  Stairs            Wheelchair Mobility     Tilt Bed    Modified Rankin (Stroke Patients Only)       Balance Overall balance assessment: Needs assistance Sitting-balance support: No upper extremity supported Sitting balance-Leahy Scale: Fair     Standing balance support: No upper extremity supported Standing balance-Leahy Scale: Fair Standing balance comment: Could ambulate without AD but drifting and needing assist at times                             Pertinent Vitals/Pain Pain Assessment Pain Assessment: No/denies pain    Home Living Family/patient expects to be discharged to:: Private residence Living Arrangements: Parent Available Help at Discharge: Family;Available PRN/intermittently Type of Home: House Home Access: Stairs to enter Entrance Stairs-Rails: None Entrance Stairs-Number of Steps: 2   Home Layout: One level Home Equipment: None      Prior Function Prior Level of Function : Independent/Modified Independent  Extremity/Trunk Assessment   Upper Extremity Assessment Upper Extremity Assessment:  (ROM WFL; MMT : 5/5; Coordination: decreased finger tip to tip and also past pointing or undershooting when reaching to flush toilet and for faucet)    Lower Extremity Assessment Lower Extremity Assessment:  (ROM WFL; MMT 5/5; decreased speed with heel/shin coordination)    Cervical / Trunk Assessment Cervical / Trunk Assessment: Normal   Communication        Cognition Arousal: Lethargic, Suspect due to medications Behavior During Therapy: Flat affect   PT - Cognitive impairments: Memory, Initiation, Problem solving                       PT - Cognition Comments: Pt flat, soft spoken, and needing cues at times.  Family reports recently medicated.  Suspect medication related         Cueing       General Comments General comments (skin integrity, edema, etc.): VSS on RA (HR 122 bpm max).  Mother present    Exercises     Assessment/Plan    PT Assessment Patient needs continued PT services  PT Problem List Cardiopulmonary status limiting activity;Decreased cognition;Decreased activity tolerance;Decreased knowledge of use of DME;Decreased balance;Decreased safety awareness;Decreased mobility;Decreased knowledge of precautions       PT Treatment Interventions DME instruction;Therapeutic exercise;Gait training;Balance training;Stair training;Neuromuscular re-education;Functional mobility training;Therapeutic activities;Patient/family education    PT Goals (Current goals can be found in the Care Plan section)  Acute Rehab PT Goals Patient Stated Goal: return home PT Goal Formulation: With patient/family Time For Goal Achievement: 01/18/24 Potential to Achieve Goals: Good Additional Goals Additional Goal #1: Pt will score >19 on DGI to indicate lower fall risk    Frequency Min 2X/week     Co-evaluation               AM-PAC PT "6 Clicks" Mobility  Outcome Measure Help needed turning from your back to your side while in a flat bed without using bedrails?: A Little Help needed moving from lying on your back to sitting on the side of a flat bed without using bedrails?: A Little Help needed moving to and from a bed to a chair (including a wheelchair)?: A Little Help needed standing up from a chair using your arms (e.g., wheelchair or bedside chair)?: A Little Help needed to walk in hospital  room?: A Little Help needed climbing 3-5 steps with a railing? : A Lot 6 Click Score: 17    End of Session Equipment Utilized During Treatment: Gait belt Activity Tolerance: Patient tolerated treatment well Patient left: in bed;with call bell/phone within reach;with bed alarm set;with family/visitor present Nurse Communication: Mobility status PT Visit Diagnosis: Other abnormalities of gait and mobility (R26.89);Muscle weakness (generalized) (M62.81)    Time: 1630-1650 PT Time Calculation (min) (ACUTE ONLY): 20 min   Charges:   PT Evaluation $PT Eval Low Complexity: 1 Low   PT General Charges $$ ACUTE PT VISIT: 1 Visit         Anise Salvo, PT Acute Rehab [facility] [phone]   Rayetta Humphrey 01/04/2024, 5:07 PM

## 2024-01-04 NOTE — Progress Notes (Signed)
 PT Cancellation Note  Patient Details Name: Devin Hudson MRN: [id number] DOB: 03-23-1986   Cancelled Treatment:    Reason Eval/Treat Not Completed: Other (comment) Reports they were told therapy would come after lunch, would rather PT come later today as pt just medicated and lethargic. Anise Salvo, PT Acute Rehab [facility] Re[facility] [phone]   Rayetta Humphrey 01/04/2024, 10:34 AM

## 2024-01-04 NOTE — Plan of Care (Signed)
  Problem: Clinical Measurements: Goal: Respiratory complications will improve Outcome: Progressing Goal: Cardiovascular complication will be avoided Outcome: Progressing   Problem: Activity: Goal: Risk for activity intolerance will decrease Outcome: Progressing   Problem: Nutrition: Goal: Adequate nutrition will be maintained Outcome: Progressing   Problem: Coping: Goal: Level of anxiety will decrease Outcome: Progressing   Problem: Elimination: Goal: Will not experience complications related to urinary retention Outcome: Progressing   Problem: Pain Managment: Goal: General experience of comfort will improve and/or be controlled Outcome: Progressing   Problem: Safety: Goal: Ability to remain free from injury will improve Outcome: Progressing   Problem: Skin Integrity: Goal: Risk for impaired skin integrity will decrease Outcome: Progressing

## 2024-01-04 NOTE — Progress Notes (Signed)
 PROGRESS NOTE    Rolen Conger  ZOX:[id number] DOB: 1986/02/04 DOA: 01/02/2024 PCP: Patient, No Pcp Per   Brief Narrative:  38 yo male with known medical history of alcohol abuse with prior alcohol withdrawal seizures, polysubstance abuse who was found down with alcohol levels.  Upon arrival to the ED patient was found to be a pallid and blood transfusion was initiated.  Patient was also noted to be profoundly hypoxic and given mental status concern for airway protection was discussed and patient was ultimately intubated.  Patient admitted to the ICU team with rapid improvement in symptoms, extubated successfully 01/03/2024 without complications otherwise protecting airway and mental status appears to be at baseline per family bedside.  Mother at bedside voices concern over recurrent rehab attempts but pt continues to sneak out and drink.  Possibly took "rush popper"> a recreational drug belonging to nitrite family (sold as nail polish remover) which creates psychoactive high, intended to be inhaled/ sniffed but it is suspected he drank the solution instead.  Assessment & Plan:   Principal Problem:   Acute hypoxic respiratory failure (HCC)   Acute toxic encephalopathy with likely metabolic component Acute alcohol intoxication, history of alcohol abuse Polysubstance abuse -In the setting of acute alcohol intoxication, polysubstance abuse with inhaled nitraies as above -Mental status now improving, ANO x 4, family confirms at bedside -Patient unfortunately has no recollection of prior events, unable to corroborate intoxicants consumed -Avoid CNS depressants/stimulants, continue CIWA protocol in the interim; patient has history of withdrawal seizures in the recent past per family  Acute hypoxic respiratory failure, resolved -Secondary to above mental status, off ventilator now on room air without any further respiratory distress or supplementation required  AKI  Lactic  acidosis Hypokalemia -Continue to diet, off IV fluids.  Elevated liver panel Hepatitis B surface antigen positive -Continue to follow liver panel, complicated by above alcohol intoxication and chronic abuse -Hepatitis B surface antigen, if acute requires no treatment.  Will likely clear on its own.  Will follow-up with PCP in 3 to 6 months for retesting, if test continues to be positive at that time would be considered chronic hepatitis B which would be indicative of treatment with antivirals.  Hyperglycemia, reactive -Advance diet, glucose currently well-controlled  DVT prophylaxis: heparin injection 5,000 Units Start: 01/02/24 1400 SCDs Start: 01/02/24 0336 Code Status:   Code Status: Full Code Family Communication: Mother at bedside  Status is: Inpatient  Dispo: The patient is from: Home              Anticipated d/c is to: To be determined              Anticipated d/c date is: 48 to 72 hours              Patient currently not medically stable for discharge  Consultants:  PCCM  Procedures:  Intubation/extubation  Antimicrobials:  None  Subjective: No acute issues or events reported overnight denies nausea vomiting diarrhea constipation headache fevers chills or chest pain  Objective: Vitals:   01/04/24 0400 01/04/24 0414 01/04/24 0500 01/04/24 0503  BP: 133/84 112/65  102/63  Pulse: 85 78  90  Resp:      Temp: 99.1 F (37.3 C)     TempSrc: Oral     SpO2:      Weight:   66 kg   Height:        Intake/Output Summary (Last 24 hours) at 01/04/2024 0743 Last data filed at 01/04/2024 0353 Gross per 24  hour  Intake 850.51 ml  Output 1950 ml  Net -1099.49 ml   Filed Weights   01/02/24 0709 01/03/24 0432 01/04/24 0500  Weight: 64.7 kg 68.3 kg 66 kg    Examination:  General:  Pleasantly resting in bed, No acute distress. HEENT:  Normocephalic atraumatic.  Sclerae nonicteric, noninjected.  Extraocular movements intact bilaterally. Neck:  Without mass or deformity.   Trachea is midline. Lungs:  Clear to auscultate bilaterally without rhonchi, wheeze, or rales. Heart:  Regular rate and rhythm.  Without murmurs, rubs, or gallops. Abdomen:  Soft, nontender, nondistended.  Without guarding or rebound. Extremities: Without cyanosis, clubbing, edema, or obvious deformity. Skin:  Warm and dry, no erythema.  Data Reviewed: I have personally reviewed following labs and imaging studies  CBC: Recent Labs  Lab 01/02/24 0115 01/02/24 0124 01/02/24 0600 01/03/24 0307  WBC 7.4  --  10.6* 8.8  NEUTROABS 3.8  --   --   --   HGB 14.9 15.6 13.8 13.6  HCT 45.0 46.0 40.2 40.5  MCV 99.6  --  96.2 95.5  PLT 243  --  196 137*   Basic Metabolic Panel: Recent Labs  Lab 01/02/24 0115 01/02/24 0124 01/02/24 0600 01/03/24 0307 01/04/24 0303  NA 141 142 137 138 129*  K 3.7 3.8 3.8 3.2* 3.5  CL 106 108 105 106 100  CO2 16*  --  18* 19* 21*  GLUCOSE 195* 187* 102* 85 104*  BUN 16 14 17 12 9   CREATININE 1.56* 1.90* 1.14 0.77 0.90  CALCIUM 7.3*  --  6.8* 7.9* 7.7*  MG  --   --  1.6* 1.8 1.9  PHOS  --   --  2.7  --   --    GFR: Estimated Creatinine Clearance: 106.9 mL/min (by C-G formula based on SCr of 0.9 mg/dL). Liver Function Tests: Recent Labs  Lab 01/02/24 0115 01/02/24 0600 01/03/24 0307  AST 122* 105* 213*  ALT 58* 53* 66*  ALKPHOS 70 55 60  BILITOT 1.7* 2.1* 3.1*  PROT 6.6 5.4* 5.1*  ALBUMIN 3.3* 2.7* 2.4*   Recent Labs  Lab 01/02/24 0115 01/03/24 0307  LIPASE 58* 37   Recent Labs  Lab 01/02/24 0115  AMMONIA 22   Coagulation Profile: Recent Labs  Lab 01/02/24 0115  INR 1.2   Cardiac Enzymes: Recent Labs  Lab 01/02/24 0600  CKTOTAL 386   BNP (last 3 results) No results for input(s): "PROBNP" in the last 8760 hours. HbA1C: Recent Labs    01/02/24 0600  HGBA1C 5.7*   CBG: Recent Labs  Lab 01/03/24 1143 01/03/24 1523 01/03/24 1951 01/03/24 2333 01/04/24 0425  GLUCAP 81 121* 153* 91 113*   Lipid Profile: Recent  Labs    01/03/24 0307  TRIG 294*   Thyroid Function Tests: Recent Labs    01/02/24 0340  TSH 0.864   Anemia Panel: No results for input(s): "VITAMINB12", "FOLATE", "FERRITIN", "TIBC", "IRON", "RETICCTPCT" in the last 72 hours. Sepsis Labs: Recent Labs  Lab 01/02/24 0340 01/02/24 0600 01/02/24 1232 01/02/24 1550  LATICACIDVEN 8.9* 5.8* 3.5* 3.2*    Recent Results (from the past 240 hours)  Blood culture (routine x 2)     Status: None (Preliminary result)   Collection Time: 01/02/24  1:26 AM   Specimen: BLOOD  Result Value Ref Range Status   Specimen Description   Final    BLOOD BLOOD LEFT FOREARM Performed at [facility], [address], [place], [place] [address]  Special Requests   Final    BOTTLES DRAWN AEROBIC AND ANAEROBIC Blood Culture adequate volume Performed at [facility], [address], [place], [place] [address]    Culture   Final    NO GROWTH 1 DAY Performed at [facility], [address] [place], [place] [address]    Report Status PENDING  Incomplete  Blood culture (routine x 2)     Status: None (Preliminary result)   Collection Time: 01/02/24  1:26 AM   Specimen: BLOOD  Result Value Ref Range Status   Specimen Description   Final    BLOOD LEFT ANTECUBITAL Performed at [facility], [address] [place], [place] [address]    Special Requests   Final    BOTTLES DRAWN AEROBIC AND ANAEROBIC Blood Culture results may not be optimal due to an inadequate volume of blood received in culture bottles Performed at [facility], [address], [place], [place] [address]    Culture   Final    NO GROWTH 1 DAY Performed at [facility], [address] [place], [place] [address]    Report Status PENDING  Incomplete  MRSA Next Gen by PCR, Nasal     Status: None   Collection Time: 01/02/24  8:25 AM   Specimen: Nasal Mucosa; Nasal Swab  Result Value Ref Range  Status   MRSA by PCR Next Gen NOT DETECTED NOT DETECTED Final    Comment: (NOTE) The GeneXpert MRSA Assay (FDA approved for NASAL specimens only), is one component of a comprehensive MRSA colonization surveillance program. It is not intended to diagnose MRSA infection nor to guide or monitor treatment for MRSA infections. Test performance is not FDA approved in patients less than 79 years old. Performed at [facility], [address], [place], [place] [address]          Radiology Studies: US Abdomen Limited RUQ (LIVER/GB) Result Date: 01/02/2024 CLINICAL DATA:  Cirrhosis. EXAM: ULTRASOUND ABDOMEN LIMITED RIGHT UPPER QUADRANT COMPARISON:  None Available. FINDINGS: Gallbladder: No gallstones or wall thickening visualized. No sonographic Murphy sign noted by sonographer. Common bile duct: Diameter: 3 mm, within normal limits Liver: No focal lesion identified. Within normal limits in parenchymal echogenicity. Portal vein is patent on color Doppler imaging with normal direction of blood flow towards the liver. Other: None. IMPRESSION: Negative.  No hepatobiliary abnormality identified. Electronically Signed   By: Danae Orleans M.D.   On: 01/02/2024 18:02   ECHOCARDIOGRAM COMPLETE Result Date: 01/02/2024    ECHOCARDIOGRAM REPORT   Patient Name:   DANEN LAPAGLIA Date of Exam: 01/02/2024 Medical Rec #:  [id number]         Height:       70.0 in Accession #:    [id number]        Weight:       142.6 lb Date of Birth:  [birth year]         BSA:          1.808 m Patient Age:    37 years          BP:           100/68 mmHg Patient Gender: M                 HR:           86 bpm. Exam Location:  Inpatient Procedure: 2D Echo, Cardiac Doppler, Color Doppler and Intracardiac  Opacification Agent (Both Spectral and Color Flow Doppler were            utilized during procedure). Indications:    Congestive Heart Failure  History:        Patient has no prior history of Echocardiogram  examinations.                 Risk Factors:Current Smoker.  Sonographer:    Karma Ganja Referring Phys: [id number] Cristopher Peru  Sonographer Comments: Technically difficult study due to poor echo windows and echo performed with patient supine and on artificial respirator. IMPRESSIONS  1. Abnormal septal motion strain and 3D EF not performed. Left ventricular ejection fraction, by estimation, is 50 to 55%. The left ventricle has low normal function. The left ventricle has no regional wall motion abnormalities. Left ventricular diastolic parameters were normal.  2. Right ventricular systolic function is normal. The right ventricular size is normal.  3. The mitral valve is abnormal. Trivial mitral valve regurgitation. No evidence of mitral stenosis.  4. The aortic valve was not well visualized. Aortic valve regurgitation is not visualized. No aortic stenosis is present.  5. The inferior vena cava is normal in size with greater than 50% respiratory variability, suggesting right atrial pressure of 3 mmHg. FINDINGS  Left Ventricle: Abnormal septal motion strain and 3D EF not performed. Left ventricular ejection fraction, by estimation, is 50 to 55%. The left ventricle has low normal function. The left ventricle has no regional wall motion abnormalities. Definity contrast agent was given IV to delineate the left ventricular endocardial borders. Strain was performed and the global longitudinal strain is indeterminate. The left ventricular internal cavity size was normal in size. There is no left ventricular hypertrophy. Left ventricular diastolic parameters were normal. Right Ventricle: The right ventricular size is normal. No increase in right ventricular wall thickness. Right ventricular systolic function is normal. Left Atrium: Left atrial size was normal in size. Right Atrium: Right atrial size was normal in size. Pericardium: There is no evidence of pericardial effusion. Mitral Valve: The mitral valve is abnormal. There  is mild thickening of the mitral valve leaflet(s). There is mild calcification of the mitral valve leaflet(s). Trivial mitral valve regurgitation. No evidence of mitral valve stenosis. MV peak gradient, 1.5 mmHg. The mean mitral valve gradient is 1.0 mmHg. Tricuspid Valve: The tricuspid valve is normal in structure. Tricuspid valve regurgitation is not demonstrated. No evidence of tricuspid stenosis. Aortic Valve: The aortic valve was not well visualized. Aortic valve regurgitation is not visualized. No aortic stenosis is present. Aortic valve mean gradient measures 2.0 mmHg. Aortic valve peak gradient measures 4.0 mmHg. Aortic valve area, by VTI measures 1.81 cm. Pulmonic Valve: The pulmonic valve was normal in structure. Pulmonic valve regurgitation is not visualized. No evidence of pulmonic stenosis. Aorta: The aortic root is normal in size and structure. Venous: The inferior vena cava is normal in size with greater than 50% respiratory variability, suggesting right atrial pressure of 3 mmHg. IAS/Shunts: No atrial level shunt detected by color flow Doppler. Additional Comments: 3D was performed not requiring image post processing on an independent workstation and was indeterminate.  LEFT VENTRICLE PLAX 2D LVIDd:         3.70 cm     Diastology LVIDs:         2.85 cm     LV e' medial:    7.29 cm/s LV PW:         0.90 cm     LV  E/e' medial:  7.8 LV IVS:        0.90 cm     LV e' lateral:   10.00 cm/s LVOT diam:     1.90 cm     LV E/e' lateral: 5.7 LV SV:         28 LV SV Index:   15 LVOT Area:     2.84 cm  LV Volumes (MOD) LV vol d, MOD A2C: 66.9 ml LV vol d, MOD A4C: 64.7 ml LV vol s, MOD A2C: 35.0 ml LV vol s, MOD A4C: 32.3 ml LV SV MOD A2C:     31.9 ml LV SV MOD A4C:     64.7 ml LV SV MOD BP:      34.5 ml RIGHT VENTRICLE            IVC RV Basal diam:  2.70 cm    IVC diam: 1.90 cm RV S prime:     7.40 cm/s LEFT ATRIUM           Index        RIGHT ATRIUM          Index LA diam:      3.00 cm 1.66 cm/m   RA Area:      9.11 cm LA Vol (A2C): 28.3 ml 15.65 ml/m  RA Volume:   20.70 ml 11.45 ml/m LA Vol (A4C): 16.9 ml 9.35 ml/m  AORTIC VALVE AV Area (Vmax):    1.76 cm AV Area (Vmean):   1.70 cm AV Area (VTI):     1.81 cm AV Vmax:           100.00 cm/s AV Vmean:          66.500 cm/s AV VTI:            0.154 m AV Peak Grad:      4.0 mmHg AV Mean Grad:      2.0 mmHg LVOT Vmax:         62.10 cm/s LVOT Vmean:        39.800 cm/s LVOT VTI:          0.098 m LVOT/AV VTI ratio: 0.64  AORTA Ao Root diam: 3.10 cm MITRAL VALVE MV Area (PHT): 3.34 cm    SHUNTS MV Area VTI:   1.79 cm    Systemic VTI:  0.10 m MV Peak grad:  1.5 mmHg    Systemic Diam: 1.90 cm MV Mean grad:  1.0 mmHg MV Vmax:       0.62 m/s MV Vmean:      45.3 cm/s MV Decel Time: 227 msec MV E velocity: 57.00 cm/s MV A velocity: 51.80 cm/s MV E/A ratio:  1.10 Charlton Haws MD Electronically signed by Charlton Haws MD Signature Date/Time: 01/02/2024/12:56:19 PM    Final         Scheduled Meds:  chlordiazePOXIDE  25 mg Oral BH-qamhs   Followed by   Melene Muller ON 01/05/2024] chlordiazePOXIDE  25 mg Oral Daily   Chlorhexidine Gluconate Cloth  6 each Topical Daily   feeding supplement  237 mL Oral BID BM   folic acid  1 mg Oral Daily   heparin injection (subcutaneous)  5,000 Units Subcutaneous Q8H   LORazepam  0-4 mg Intravenous Q4H   Followed by   Melene Muller ON 01/05/2024] LORazepam  0-4 mg Intravenous Q8H   multivitamin with minerals  1 tablet Oral Daily   thiamine  100 mg Oral Daily   Continuous Infusions:   LOS: 2  days   Time spent:  Azucena Fallen, DO [facility]  If 7PM-7AM, please contact night-coverage [email or web address]  01/04/2024, 7:43 AM

## 2024-01-05 DIAGNOSIS — J9601 Acute respiratory failure with hypoxia: Secondary | ICD-10-CM | POA: Diagnosis not present

## 2024-01-05 LAB — COMPREHENSIVE METABOLIC PANEL
ALT: 69 U/L — ABNORMAL HIGH (ref 0–44)
AST: 140 U/L — ABNORMAL HIGH (ref 15–41)
Albumin: 2.9 g/dL — ABNORMAL LOW (ref 3.5–5.0)
Alkaline Phosphatase: 75 U/L (ref 38–126)
Anion gap: 10 (ref 5–15)
BUN: 11 mg/dL (ref 6–20)
CO2: 20 mmol/L — ABNORMAL LOW (ref 22–32)
Calcium: 8.3 mg/dL — ABNORMAL LOW (ref 8.9–10.3)
Chloride: 106 mmol/L (ref 98–111)
Creatinine, Ser: 0.63 mg/dL (ref 0.61–1.24)
GFR, Estimated: >60 mL/min (ref >60)
Glucose, Bld: 100 mg/dL — ABNORMAL HIGH (ref 70–99)
Potassium: 3.8 mmol/L (ref 3.5–5.1)
Sodium: 136 mmol/L (ref 135–145)
Total Bilirubin: 1.4 mg/dL — ABNORMAL HIGH (ref 0.0–1.2)
Total Protein: 6.3 g/dL — ABNORMAL LOW (ref 6.5–8.1)

## 2024-01-05 LAB — CBC
HCT: 42.8 % (ref 39.0–52.0)
Hemoglobin: 14.1 g/dL (ref 13.0–17.0)
MCH: 32.5 pg (ref 26.0–34.0)
MCHC: 32.9 g/dL (ref 30.0–36.0)
MCV: 98.6 fL (ref 80.0–100.0)
Platelets: 138 10*3/uL — ABNORMAL LOW (ref 150–400)
RBC: 4.34 MIL/uL (ref 4.22–5.81)
RDW: 13.3 % (ref 11.5–15.5)
WBC: 7 10*3/uL (ref 4.0–10.5)
nRBC: 0 % (ref 0.0–0.2)

## 2024-01-05 MED ORDER — LOPERAMIDE HCL 1 MG/7.5ML PO SUSP: 2.0000 mg | ORAL | Status: AC | PRN

## 2024-01-05 MED ORDER — HYDROXYZINE HCL 25 MG PO TABS
25.0000 mg | ORAL_TABLET | Freq: Four times a day (QID) | ORAL | Status: AC | PRN
  Administered 2024-01-06 – 2024-01-07 (×2): 25 mg via ORAL
  Filled 2024-01-05 (×2): qty 1

## 2024-01-05 NOTE — Progress Notes (Signed)
 PROGRESS NOTE    Devin Hudson  HYQ:[id number] DOB: [birth year] DOA: 01/02/2024 PCP: Patient, No Pcp Per   Brief Narrative:  38 yo male with known medical history of alcohol abuse with prior alcohol withdrawal seizures, polysubstance abuse who was found down with alcohol levels.  Upon arrival to the ED patient was found to be a pallid and blood transfusion was initiated.  Patient was also noted to be profoundly hypoxic and given mental status concern for airway protection was discussed and patient was ultimately intubated.  Patient admitted to the ICU team with rapid improvement in symptoms, extubated successfully 01/03/2024 without complications otherwise protecting airway and mental status appears to be at baseline per family bedside.  Mother at bedside voices concern over recurrent rehab attempts but pt continues to sneak out and drink.  Possibly took "rush popper"> a recreational drug belonging to nitrite family (sold as nail polish remover) which creates psychoactive high, intended to be inhaled/ sniffed but it is suspected he drank the solution instead.  Assessment & Plan:   Principal Problem:   Acute hypoxic respiratory failure (HCC)   Acute toxic encephalopathy with likely metabolic component Acute alcohol intoxication, history of alcohol abuse Polysubstance abuse -In the setting of acute alcohol intoxication, polysubstance abuse with inhaled nitrites as above -Mental status now improving, ANO x 4, family confirms at bedside -Patient unfortunately has no recollection of prior events, unable to corroborate intoxicants consumed -Avoid CNS depressants/stimulants, continue CIWA protocol in the interim continues to have elevated CIWA score -Patient has history of withdrawal seizures in the recent past per family -high risk for recurrent  Acute hypoxic respiratory failure, resolved -Secondary to above mental status, off ventilator now on room air without any further respiratory distress  or supplementation required  AKI  Lactic acidosis Hypokalemia -Continue to diet, off IV fluids.  Elevated liver panel Hepatitis B surface antigen positive -Continue to follow liver panel, complicated by above alcohol intoxication and chronic abuse -Hepatitis B surface antigen, if acute requires no treatment.  Will likely clear on its own.  Will follow-up with PCP in 3 to 6 months for retesting, if test continues to be positive at that time would be considered chronic hepatitis B which would be indicative of treatment with antivirals.  Hyperglycemia, reactive -Advance diet, glucose currently well-controlled  DVT prophylaxis: heparin injection 5,000 Units Start: 01/02/24 1400 SCDs Start: 01/02/24 0336 Code Status:   Code Status: Full Code Family Communication: Mother at bedside  Status is: Inpatient  Dispo: The patient is from: Home              Anticipated d/c is to: To be determined              Anticipated d/c date is: 48 to 72 hours              Patient currently not medically stable for discharge, continues to require benzodiazepines for alcohol withdrawals  Consultants:  PCCM  Procedures:  Intubation/extubation  Antimicrobials:  None  Subjective: No acute issues or events reported overnight denies nausea vomiting diarrhea constipation headache fevers chills or chest pain  Objective: Vitals:   01/05/24 0300 01/05/24 0400 01/05/24 0500 01/05/24 0600  BP: 121/88 124/82 116/86 130/83  Pulse: 84 90 89 87  Resp: (!) 21 (!) 21 19 16   Temp:  98.2 F (36.8 C)    TempSrc:  Oral    SpO2: 97% 95% 94% 96%  Weight:      Height:  Intake/Output Summary (Last 24 hours) at 01/05/2024 0736 Last data filed at 01/05/2024 0115 Gross per 24 hour  Intake 360 ml  Output 425 ml  Net -65 ml   Filed Weights   01/02/24 0709 01/03/24 0432 01/04/24 0500  Weight: 64.7 kg 68.3 kg 66 kg    Examination:  General:  Pleasantly resting in bed, No acute distress. HEENT:   Normocephalic atraumatic.  Sclerae nonicteric, noninjected.  Extraocular movements intact bilaterally. Neck:  Without mass or deformity.  Trachea is midline. Lungs:  Clear to auscultate bilaterally without rhonchi, wheeze, or rales. Heart:  Regular rate and rhythm.  Without murmurs, rubs, or gallops. Abdomen:  Soft, nontender, nondistended.  Without guarding or rebound. Extremities: Without cyanosis, clubbing, edema, or obvious deformity. Skin:  Warm and dry, no erythema.  Data Reviewed: I have personally reviewed following labs and imaging studies  CBC: Recent Labs  Lab 01/02/24 0115 01/02/24 0124 01/02/24 0600 01/03/24 0307 01/05/24 0239  WBC 7.4  --  10.6* 8.8 7.0  NEUTROABS 3.8  --   --   --   --   HGB 14.9 15.6 13.8 13.6 14.1  HCT 45.0 46.0 40.2 40.5 42.8  MCV 99.6  --  96.2 95.5 98.6  PLT 243  --  196 137* 138*   Basic Metabolic Panel: Recent Labs  Lab 01/02/24 0115 01/02/24 0124 01/02/24 0600 01/03/24 0307 01/04/24 0303 01/05/24 0239  NA 141 142 137 138 129* 136  K 3.7 3.8 3.8 3.2* 3.5 3.8  CL 106 108 105 106 100 106  CO2 16*  --  18* 19* 21* 20*  GLUCOSE 195* 187* 102* 85 104* 100*  BUN 16 14 17 12 9 11   CREATININE 1.56* 1.90* 1.14 0.77 0.90 0.63  CALCIUM 7.3*  --  6.8* 7.9* 7.7* 8.3*  MG  --   --  1.6* 1.8 1.9  --   PHOS  --   --  2.7  --   --   --    GFR: Estimated Creatinine Clearance: 120.3 mL/min (by C-G formula based on SCr of 0.63 mg/dL). Liver Function Tests: Recent Labs  Lab 01/02/24 0115 01/02/24 0600 01/03/24 0307 01/05/24 0239  AST 122* 105* 213* 140*  ALT 58* 53* 66* 69*  ALKPHOS 70 55 60 75  BILITOT 1.7* 2.1* 3.1* 1.4*  PROT 6.6 5.4* 5.1* 6.3*  ALBUMIN 3.3* 2.7* 2.4* 2.9*   Recent Labs  Lab 01/02/24 0115 01/03/24 0307  LIPASE 58* 37   Recent Labs  Lab 01/02/24 0115  AMMONIA 22   Coagulation Profile: Recent Labs  Lab 01/02/24 0115  INR 1.2   Cardiac Enzymes: Recent Labs  Lab 01/02/24 0600  CKTOTAL 386   BNP (last  3 results) No results for input(s): "PROBNP" in the last 8760 hours. HbA1C: No results for input(s): "HGBA1C" in the last 72 hours.  CBG: Recent Labs  Lab 01/03/24 1951 01/03/24 2333 01/04/24 0425 01/04/24 0751 01/04/24 1112  GLUCAP 153* 91 113* 95 153*   Lipid Profile: Recent Labs    01/03/24 0307  TRIG 294*   Thyroid Function Tests: No results for input(s): "TSH", "T4TOTAL", "FREET4", "T3FREE", "THYROIDAB" in the last 72 hours.  Anemia Panel: No results for input(s): "VITAMINB12", "FOLATE", "FERRITIN", "TIBC", "IRON", "RETICCTPCT" in the last 72 hours. Sepsis Labs: Recent Labs  Lab 01/02/24 0340 01/02/24 0600 01/02/24 1232 01/02/24 1550  LATICACIDVEN 8.9* 5.8* 3.5* 3.2*    Recent Results (from the past 240 hours)  Blood culture (routine x 2)  Status: None (Preliminary result)   Collection Time: 01/02/24  1:26 AM   Specimen: BLOOD  Result Value Ref Range Status   Specimen Description   Final    BLOOD BLOOD LEFT FOREARM Performed at [facility], [address] [place], [place] [address]    Special Requests   Final    BOTTLES DRAWN AEROBIC AND ANAEROBIC Blood Culture adequate volume Performed at [facility], [address] [place], [place] [address]    Culture   Final    NO GROWTH 2 DAYS Performed at [facility], [address], [place], [place] [address]    Report Status PENDING  Incomplete  Blood culture (routine x 2)     Status: None (Preliminary result)   Collection Time: 01/02/24  1:26 AM   Specimen: BLOOD  Result Value Ref Range Status   Specimen Description   Final    BLOOD LEFT ANTECUBITAL Performed at [facility], [address], [place], [place] [address]    Special Requests   Final    BOTTLES DRAWN AEROBIC AND ANAEROBIC Blood Culture results may not be optimal due to an inadequate volume of blood received in culture bottles Performed at [facility], [address] [place], [place] [address]    Culture   Final    NO GROWTH 2 DAYS Performed at [facility], [address], [place], [place] [address]    Report Status PENDING  Incomplete  MRSA Next Gen by PCR, Nasal     Status: None   Collection Time: 01/02/24  8:25 AM   Specimen: Nasal Mucosa; Nasal Swab  Result Value Ref Range Status   MRSA by PCR Next Gen NOT DETECTED NOT DETECTED Final    Comment: (NOTE) The GeneXpert MRSA Assay (FDA approved for NASAL specimens only), is one component of a comprehensive MRSA colonization surveillance program. It is not intended to diagnose MRSA infection nor to guide or monitor treatment for MRSA infections. Test performance is not FDA approved in patients less than 33 years old. Performed at [facility], [address] [place], [place] [address]          Radiology Studies: No results found.       Scheduled Meds:  chlordiazePOXIDE  25 mg Oral Daily   Chlorhexidine Gluconate Cloth  6 each Topical Daily   feeding supplement  237 mL Oral BID BM   folic acid  1 mg Oral Daily   heparin injection (subcutaneous)  5,000 Units Subcutaneous Q8H   LORazepam  0-4 mg Intravenous Q4H   Followed by   LORazepam  0-4 mg Intravenous Q8H   multivitamin with minerals  1 tablet Oral Daily   thiamine  100 mg Oral Daily   Continuous Infusions:   LOS: 3 days   Time spent:  Azucena Fallen, DO [facility]  If 7PM-7AM, please contact night-coverage [email or web address]  01/05/2024, 7:36 AM

## 2024-01-05 NOTE — Plan of Care (Signed)
  Problem: Clinical Measurements: Goal: Diagnostic test results will improve Outcome: Progressing Goal: Respiratory complications will improve Outcome: Progressing   Problem: Activity: Goal: Risk for activity intolerance will decrease Outcome: Progressing   Problem: Nutrition: Goal: Adequate nutrition will be maintained Outcome: Progressing   Problem: Elimination: Goal: Will not experience complications related to urinary retention Outcome: Progressing

## 2024-01-05 NOTE — Plan of Care (Signed)
  Problem: Education: Goal: Knowledge of General Education information will improve Description: Including pain rating scale, medication(s)/side effects and non-pharmacologic comfort measures Outcome: Progressing   Problem: Clinical Measurements: Goal: Ability to maintain clinical measurements within normal limits will improve Outcome: Progressing Goal: Cardiovascular complication will be avoided Outcome: Progressing   Problem: Nutrition: Goal: Adequate nutrition will be maintained Outcome: Progressing   Problem: Coping: Goal: Level of anxiety will decrease Outcome: Progressing   Problem: Elimination: Goal: Will not experience complications related to bowel motility Outcome: Progressing   Problem: Pain Managment: Goal: General experience of comfort will improve and/or be controlled Outcome: Progressing   Problem: Health Behavior/Discharge Planning: Goal: Ability to manage health-related needs will improve Outcome: Not Progressing   Problem: Activity: Goal: Risk for activity intolerance will decrease Outcome: Not Progressing

## 2024-01-06 DIAGNOSIS — J9601 Acute respiratory failure with hypoxia: Secondary | ICD-10-CM | POA: Diagnosis not present

## 2024-01-06 LAB — COMPREHENSIVE METABOLIC PANEL
ALT: 75 U/L — ABNORMAL HIGH (ref 0–44)
AST: 118 U/L — ABNORMAL HIGH (ref 15–41)
Albumin: 3 g/dL — ABNORMAL LOW (ref 3.5–5.0)
Alkaline Phosphatase: 85 U/L (ref 38–126)
Anion gap: 8 (ref 5–15)
BUN: 12 mg/dL (ref 6–20)
CO2: 22 mmol/L (ref 22–32)
Calcium: 8.8 mg/dL — ABNORMAL LOW (ref 8.9–10.3)
Chloride: 104 mmol/L (ref 98–111)
Creatinine, Ser: 0.76 mg/dL (ref 0.61–1.24)
GFR, Estimated: >60 mL/min (ref >60)
Glucose, Bld: 124 mg/dL — ABNORMAL HIGH (ref 70–99)
Potassium: 4.2 mmol/L (ref 3.5–5.1)
Sodium: 134 mmol/L — ABNORMAL LOW (ref 135–145)
Total Bilirubin: 0.8 mg/dL (ref 0.0–1.2)
Total Protein: 6.6 g/dL (ref 6.5–8.1)

## 2024-01-06 LAB — CBC
HCT: 41.5 % (ref 39.0–52.0)
Hemoglobin: 14.3 g/dL (ref 13.0–17.0)
MCH: 33.2 pg (ref 26.0–34.0)
MCHC: 34.5 g/dL (ref 30.0–36.0)
MCV: 96.3 fL (ref 80.0–100.0)
Platelets: 169 10*3/uL (ref 150–400)
RBC: 4.31 MIL/uL (ref 4.22–5.81)
RDW: 13.6 % (ref 11.5–15.5)
WBC: 7.6 10*3/uL (ref 4.0–10.5)
nRBC: 0 % (ref 0.0–0.2)

## 2024-01-06 MED ORDER — LORAZEPAM 1 MG PO TABS
1.0000 mg | ORAL_TABLET | ORAL | Status: DC | PRN
  Administered 2024-01-07: 1 mg via ORAL
  Filled 2024-01-06: qty 1

## 2024-01-06 NOTE — Plan of Care (Signed)

## 2024-01-06 NOTE — Progress Notes (Signed)
 PROGRESS NOTE    Devin Hudson  ZOX:[id number] DOB: 02-Jul-1986 DOA: 01/02/2024 PCP: Patient, No Pcp Per   Brief Narrative:  38 yo male with known medical history of alcohol abuse with prior alcohol withdrawal seizures, polysubstance abuse who was found down with alcohol levels.  Upon arrival to the ED patient was found to be a pallid and blood transfusion was initiated.  Patient was also noted to be profoundly hypoxic and given mental status concern for airway protection was discussed and patient was ultimately intubated.  Patient admitted to the ICU team with rapid improvement in symptoms, extubated successfully 01/03/2024 without complications otherwise protecting airway and mental status appears to be at baseline per family bedside.  Mother at bedside voices concern over recurrent rehab attempts but pt continues to sneak out and drink.  Possibly took "rush popper"> a recreational drug belonging to nitrite family (sold as nail polish remover) which creates psychoactive high, intended to be inhaled/ sniffed but it is suspected he drank the solution instead.  Assessment & Plan:   Principal Problem:   Acute hypoxic respiratory failure (HCC)   Acute toxic encephalopathy with likely metabolic component Acute alcohol intoxication, history of alcohol abuse Polysubstance abuse -In the setting of acute alcohol intoxication, polysubstance abuse with inhaled nitrites as above -Mental status now improving, ANO x 4, family confirms at bedside -Patient unfortunately has no recollection of prior events, unable to corroborate intoxicants consumed -Avoid CNS depressants/stimulants, continue CIWA protocol in the interim continues to have elevated CIWA scores -Patient has history of withdrawal seizures in the recent past per family -high risk for recurrent  Acute hypoxic respiratory failure, resolved -Secondary to above mental status, off ventilator now on room air without any further respiratory  distress or supplementation required  AKI  Lactic acidosis Hypokalemia -Continue to diet, off IV fluids.  Elevated liver panel Hepatitis B surface antigen positive -Continue to follow liver panel, complicated by above alcohol intoxication and chronic abuse -Hepatitis B surface antigen, if acute requires no treatment.  Will likely clear on its own.  Will follow-up with PCP in 3 to 6 months for retesting, if test continues to be positive at that time would be considered chronic hepatitis B which would be indicative of treatment with antivirals.  Hyperglycemia, reactive -Advance diet, glucose currently well-controlled  DVT prophylaxis: heparin injection 5,000 Units Start: 01/02/24 1400 SCDs Start: 01/02/24 0336 Code Status:   Code Status: Full Code Family Communication: Mother at bedside  Status is: Inpatient  Dispo: The patient is from: Home              Anticipated d/c is to: To be determined              Anticipated d/c date is: 48 to 72 hours              Patient currently not medically stable for discharge, continues to require benzodiazepines for alcohol withdrawals  Consultants:  PCCM  Procedures:  Intubation/extubation  Antimicrobials:  None  Subjective: No acute issues or events reported overnight denies nausea vomiting diarrhea constipation headache fevers chills or chest pain  Objective: Vitals:   01/06/24 0130 01/06/24 0400 01/06/24 0500 01/06/24 0601  BP: 123/78 119/78 106/84 112/77  Pulse: (!) 110 81 88 (!) 102  Resp:  20 20 16   Temp:   97.7 F (36.5 C) 98 F (36.7 C)  TempSrc:   Oral Oral  SpO2:  97% 94% 98%  Weight:    64.5 kg  Height:  5' 10.5" (1.791 m)    Intake/Output Summary (Last 24 hours) at 01/06/2024 0800 Last data filed at 01/05/2024 1653 Gross per 24 hour  Intake --  Output 1150 ml  Net -1150 ml   Filed Weights   01/04/24 0500 01/05/24 0800 01/06/24 0601  Weight: 66 kg 64.8 kg 64.5 kg    Examination:  General:  Pleasantly  resting in bed, No acute distress. HEENT:  Normocephalic atraumatic.  Sclerae nonicteric, noninjected.  Extraocular movements intact bilaterally. Neck:  Without mass or deformity.  Trachea is midline. Lungs:  Clear to auscultate bilaterally without rhonchi, wheeze, or rales. Heart:  Regular rate and rhythm.  Without murmurs, rubs, or gallops. Abdomen:  Soft, nontender, nondistended.  Without guarding or rebound. Extremities: Without cyanosis, clubbing, edema, or obvious deformity. Skin:  Warm and dry, no erythema.  Data Reviewed: I have personally reviewed following labs and imaging studies  CBC: Recent Labs  Lab 01/02/24 0115 01/02/24 0124 01/02/24 0600 01/03/24 0307 01/05/24 0239 01/06/24 0637  WBC 7.4  --  10.6* 8.8 7.0 7.6  NEUTROABS 3.8  --   --   --   --   --   HGB 14.9 15.6 13.8 13.6 14.1 14.3  HCT 45.0 46.0 40.2 40.5 42.8 41.5  MCV 99.6  --  96.2 95.5 98.6 96.3  PLT 243  --  196 137* 138* 169   Basic Metabolic Panel: Recent Labs  Lab 01/02/24 0600 01/03/24 0307 01/04/24 0303 01/05/24 0239 01/06/24 0637  NA 137 138 129* 136 134*  K 3.8 3.2* 3.5 3.8 4.2  CL 105 106 100 106 104  CO2 18* 19* 21* 20* 22  GLUCOSE 102* 85 104* 100* 124*  BUN 17 12 9 11 12   CREATININE 1.14 0.77 0.90 0.63 0.76  CALCIUM 6.8* 7.9* 7.7* 8.3* 8.8*  MG 1.6* 1.8 1.9  --   --   PHOS 2.7  --   --   --   --    GFR: Estimated Creatinine Clearance: 117.6 mL/min (by C-G formula based on SCr of 0.76 mg/dL). Liver Function Tests: Recent Labs  Lab 01/02/24 0115 01/02/24 0600 01/03/24 0307 01/05/24 0239 01/06/24 0637  AST 122* 105* 213* 140* 118*  ALT 58* 53* 66* 69* 75*  ALKPHOS 70 55 60 75 85  BILITOT 1.7* 2.1* 3.1* 1.4* 0.8  PROT 6.6 5.4* 5.1* 6.3* 6.6  ALBUMIN 3.3* 2.7* 2.4* 2.9* 3.0*   Recent Labs  Lab 01/02/24 0115 01/03/24 0307  LIPASE 58* 37   Recent Labs  Lab 01/02/24 0115  AMMONIA 22   Coagulation Profile: Recent Labs  Lab 01/02/24 0115  INR 1.2   Cardiac  Enzymes: Recent Labs  Lab 01/02/24 0600  CKTOTAL 386   BNP (last 3 results) No results for input(s): "PROBNP" in the last 8760 hours. HbA1C: No results for input(s): "HGBA1C" in the last 72 hours.  CBG: Recent Labs  Lab 01/03/24 1951 01/03/24 2333 01/04/24 0425 01/04/24 0751 01/04/24 1112  GLUCAP 153* 91 113* 95 153*   Lipid Profile: No results for input(s): "CHOL", "HDL", "LDLCALC", "TRIG", "CHOLHDL", "LDLDIRECT" in the last 72 hours.  Thyroid Function Tests: No results for input(s): "TSH", "T4TOTAL", "FREET4", "T3FREE", "THYROIDAB" in the last 72 hours.  Anemia Panel: No results for input(s): "VITAMINB12", "FOLATE", "FERRITIN", "TIBC", "IRON", "RETICCTPCT" in the last 72 hours. Sepsis Labs: Recent Labs  Lab 01/02/24 0340 01/02/24 0600 01/02/24 1232 01/02/24 1550  LATICACIDVEN 8.9* 5.8* 3.5* 3.2*    Recent Results (from the past 240 hours)  Blood culture (routine x 2)     Status: None (Preliminary result)   Collection Time: 01/02/24  1:26 AM   Specimen: BLOOD  Result Value Ref Range Status   Specimen Description   Final    BLOOD BLOOD LEFT FOREARM Performed at [facility], [address] [place], [place] [address]    Special Requests   Final    BOTTLES DRAWN AEROBIC AND ANAEROBIC Blood Culture adequate volume Performed at [facility], [address] [place], [place] [address]    Culture   Final    NO GROWTH 3 DAYS Performed at [facility], [address], [place], [place] [address]    Report Status PENDING  Incomplete  Blood culture (routine x 2)     Status: None (Preliminary result)   Collection Time: 01/02/24  1:26 AM   Specimen: BLOOD  Result Value Ref Range Status   Specimen Description   Final    BLOOD LEFT ANTECUBITAL Performed at [facility], [address], [place], [place] [address]    Special Requests   Final    BOTTLES DRAWN AEROBIC AND ANAEROBIC Blood Culture results may  not be optimal due to an inadequate volume of blood received in culture bottles Performed at [facility], [address], [place], [place] [address]    Culture   Final    NO GROWTH 3 DAYS Performed at [facility], [address], [place], [place] [address]    Report Status PENDING  Incomplete  MRSA Next Gen by PCR, Nasal     Status: None   Collection Time: 01/02/24  8:25 AM   Specimen: Nasal Mucosa; Nasal Swab  Result Value Ref Range Status   MRSA by PCR Next Gen NOT DETECTED NOT DETECTED Final    Comment: (NOTE) The GeneXpert MRSA Assay (FDA approved for NASAL specimens only), is one component of a comprehensive MRSA colonization surveillance program. It is not intended to diagnose MRSA infection nor to guide or monitor treatment for MRSA infections. Test performance is not FDA approved in patients less than 21 years old. Performed at [facility], [address], [place], [place] [address]          Radiology Studies: No results found.       Scheduled Meds:  Chlorhexidine Gluconate Cloth  6 each Topical Daily   feeding supplement  237 mL Oral BID BM   folic acid  1 mg Oral Daily   heparin injection (subcutaneous)  5,000 Units Subcutaneous Q8H   multivitamin with minerals  1 tablet Oral Daily   thiamine  100 mg Oral Daily   Continuous Infusions:   LOS: 4 days   Time spent:  Azucena Fallen, DO [facility]  If 7PM-7AM, please contact night-coverage [email or web address]  01/06/2024, 8:00 AM

## 2024-01-07 ENCOUNTER — Other Ambulatory Visit (HOSPITAL_COMMUNITY): Payer: Self-pay

## 2024-01-07 ENCOUNTER — Other Ambulatory Visit: Payer: Self-pay

## 2024-01-07 DIAGNOSIS — J9601 Acute respiratory failure with hypoxia: Secondary | ICD-10-CM | POA: Diagnosis not present

## 2024-01-07 LAB — CBC
HCT: 41.8 % (ref 39.0–52.0)
Hemoglobin: 13.5 g/dL (ref 13.0–17.0)
MCH: 32.2 pg (ref 26.0–34.0)
MCHC: 32.3 g/dL (ref 30.0–36.0)
MCV: 99.8 fL (ref 80.0–100.0)
Platelets: 186 10*3/uL (ref 150–400)
RBC: 4.19 MIL/uL — ABNORMAL LOW (ref 4.22–5.81)
RDW: 13.8 % (ref 11.5–15.5)
WBC: 6.3 10*3/uL (ref 4.0–10.5)
nRBC: 0 % (ref 0.0–0.2)

## 2024-01-07 LAB — COMPREHENSIVE METABOLIC PANEL
ALT: 65 U/L — ABNORMAL HIGH (ref 0–44)
AST: 76 U/L — ABNORMAL HIGH (ref 15–41)
Albumin: 2.8 g/dL — ABNORMAL LOW (ref 3.5–5.0)
Alkaline Phosphatase: 77 U/L (ref 38–126)
Anion gap: 7 (ref 5–15)
BUN: 11 mg/dL (ref 6–20)
CO2: 25 mmol/L (ref 22–32)
Calcium: 8.6 mg/dL — ABNORMAL LOW (ref 8.9–10.3)
Chloride: 104 mmol/L (ref 98–111)
Creatinine, Ser: 0.85 mg/dL (ref 0.61–1.24)
GFR, Estimated: >60 mL/min (ref >60)
Glucose, Bld: 132 mg/dL — ABNORMAL HIGH (ref 70–99)
Potassium: 3.7 mmol/L (ref 3.5–5.1)
Sodium: 136 mmol/L (ref 135–145)
Total Bilirubin: 0.6 mg/dL (ref 0.0–1.2)
Total Protein: 6.2 g/dL — ABNORMAL LOW (ref 6.5–8.1)

## 2024-01-07 LAB — CULTURE, BLOOD (ROUTINE X 2)
Culture: NO GROWTH
Culture: NO GROWTH
Special Requests: ADEQUATE

## 2024-01-07 MED ORDER — FOLIC ACID 1 MG PO TABS
1.0000 mg | ORAL_TABLET | Freq: Every day | ORAL | 0 refills | Status: AC
  Filled 2024-01-07: qty 30, 30d supply, fill #0

## 2024-01-07 MED ORDER — ADULT MULTIVITAMIN W/MINERALS CH
1.0000 | ORAL_TABLET | Freq: Every day | ORAL | 0 refills | Status: AC
  Filled 2024-01-07: qty 30, 30d supply, fill #0

## 2024-01-07 MED ORDER — HYDROXYZINE HCL 25 MG PO TABS
25.0000 mg | ORAL_TABLET | Freq: Three times a day (TID) | ORAL | 0 refills | Status: AC | PRN
  Filled 2024-01-07: qty 90, 30d supply, fill #0

## 2024-01-07 MED ORDER — VITAMIN B-1 100 MG PO TABS
100.0000 mg | ORAL_TABLET | Freq: Every day | ORAL | 0 refills | Status: AC
  Filled 2024-01-07: qty 30, 30d supply, fill #0

## 2024-01-07 MED ORDER — CHLORDIAZEPOXIDE HCL 5 MG PO CAPS
ORAL_CAPSULE | ORAL | 0 refills | Status: AC
  Filled 2024-01-07: qty 9, 6d supply, fill #0

## 2024-01-07 NOTE — Plan of Care (Signed)

## 2024-01-07 NOTE — Progress Notes (Signed)
 AVS reviewed w/ pt who verbalized an understanding- PIV removed as noted- Pt dressed for d/c.  Return to work note in d/c envelope- No other questions - Pt to lobby w/ mom - to home

## 2024-01-07 NOTE — Discharge Summary (Signed)
 Physician Discharge Summary  Devin Hudson ZOX:[id number] DOB: 1986-02-29 DOA: 01/02/2024  PCP: Patient, No Pcp Per  Admit date: 01/02/2024 Discharge date: 01/07/2024  Admitted From: Home Disposition: Home  Recommendations for Outpatient Follow-up:  Follow up with PCP in 1-2 weeks Follow-up with psychiatry as discussed  Home Health: None Equipment/Devices: None  Discharge Condition: Stable CODE STATUS: Full Diet recommendation: Regular diet as tolerated  Brief/Interim Summary: Patient is a 38 yo male with known medical history of alcohol abuse with prior alcohol withdrawal seizures, polysubstance abuse who was found down with alcohol levels.  Upon arrival to the ED patient was found to be a pallid and blood transfusion was initiated.  Patient was also noted to be profoundly hypoxic and given mental status concern for airway protection was discussed and patient was ultimately intubated.  Patient admitted to the ICU team with rapid improvement in symptoms, extubated successfully 01/03/2024 without complications otherwise protecting airway and mental status appears to be at baseline per family bedside. Mother at bedside voices concern over recurrent rehab attempts but pt continues to sneak out and drink.  Possibly took "rush popper"> a recreational drug belonging to nitrite family (sold as nail polish remover) which creates psychoactive high, intended to be inhaled/ sniffed but it is suspected he drank the solution instead.  Patient admitted as above with what appears to be acute toxic encephalopathy in the setting of alcohol abuse as well as possible polypharmacy in the setting of nitrate inhalant as above.  Patient initially intubated due to mental status concerns and poor airway protection.  Patient extubated quite quickly although mental status continue to remain diminished from baseline per family.  He is now awake alert oriented and back to baseline, lengthy discussion daily about need for  outpatient follow-up given concern over repeated incidents of alcohol use abuse and intoxication.  Patient now no longer scoring on CIWA protocol, will discharge patient on Librium taper with as needed hydroxyzine to cover until he can follow-up with a new PCP and/or psychiatrist.  Patient's mother at bedside concurs with plan to move forward with some sort of therapy depending on what the patient is agreeable to.  At this time he is otherwise stable and agreeable for discharge back home.  Discharge Diagnoses:  Principal Problem:   Acute hypoxic respiratory failure (HCC)  Acute toxic encephalopathy with likely metabolic component Acute alcohol intoxication, history of alcohol abuse Polysubstance abuse -In the setting of acute alcohol intoxication, polysubstance abuse with inhaled nitrites as above -Mental status now improving, ANO x 4, family confirms at bedside -Patient unfortunately has no recollection of prior events, unable to corroborate intoxicants consumed -Avoid CNS depressants/stimulants, continue CIWA protocol in the interim patient no longer requiring as needed Ativan -Patient has history of withdrawal seizures in the recent past per family -high risk for recurrent episodes   Acute hypoxic respiratory failure, resolved -Secondary to above mental status, off ventilator now on room air without any further respiratory distress or supplementation required   AKI  Lactic acidosis Hypokalemia -Continue to diet, off IV fluids.   Elevated liver panel Hepatitis B surface antigen positive -Continue to follow liver panel, complicated by above alcohol intoxication and chronic abuse -Hepatitis B surface antigen, if acute requires no treatment.  Will likely clear on its own.  Will follow-up with PCP in 3 to 6 months for retesting, if test continues to be positive at that time would be considered chronic hepatitis B which would be indicative of treatment with antivirals.   Hyperglycemia,  reactive -Advance diet, glucose currently well-controlled  Discharge Instructions   Allergies as of 01/07/2024       Reactions   Other Anaphylaxis, Swelling, Other (See Comments)   Bologna allergy, throat swelling as a child   Peanut-containing Drug Products Anaphylaxis, Hives, Other (See Comments)   "Peanut Butter Flavoring Agents," as well = ANYTHING PEANUT-RELATED!!        Medication List     STOP taking these medications    amoxicillin-clavulanate 875-125 MG tablet Commonly known as: AUGMENTIN   chlorhexidine 0.12 % solution Commonly known as: Peridex   famotidine 20 MG tablet Commonly known as: PEPCID   guaiFENesin 100 MG/5ML liquid Commonly known as: ROBITUSSIN   LORazepam 1 MG tablet Commonly known as: Ativan   sucralfate 1 GM/10ML suspension Commonly known as: Carafate   traZODone 50 MG tablet Commonly known as: DESYREL       TAKE these medications    CertaVite/Antioxidants Tabs Take 1 tablet by mouth daily.   chlordiazePOXIDE 5 MG capsule Commonly known as: LIBRIUM Take 1 capsule (5 mg total) by mouth 2 (two) times daily for 3 days, THEN 1 capsule (5 mg total) daily for 3 days. Start taking on: January 07, 2024 What changed:  medication strength See the new instructions.   folic acid 1 MG tablet Commonly known as: FOLVITE Take 1 tablet (1 mg total) by mouth daily.   hydrOXYzine 25 MG tablet Commonly known as: ATARAX Take 1 tablet (25 mg total) by mouth every 8 (eight) hours as needed for anxiety (or CIWA score </= 10).   thiamine 100 MG tablet Commonly known as: VITAMIN B1 Take 1 tablet (100 mg total) by mouth daily.        Allergies  Allergen Reactions   Other Anaphylaxis, Swelling and Other (See Comments)    Bologna allergy, throat swelling as a child   Peanut-Containing Drug Products Anaphylaxis, Hives and Other (See Comments)    "Peanut Butter Flavoring Agents," as well = ANYTHING PEANUT-RELATED!!     Consultations: PCCM  Procedures/Studies: US Abdomen Limited RUQ (LIVER/GB) Result Date: 01/02/2024 CLINICAL DATA:  Cirrhosis. EXAM: ULTRASOUND ABDOMEN LIMITED RIGHT UPPER QUADRANT COMPARISON:  None Available. FINDINGS: Gallbladder: No gallstones or wall thickening visualized. No sonographic Murphy sign noted by sonographer. Common bile duct: Diameter: 3 mm, within normal limits Liver: No focal lesion identified. Within normal limits in parenchymal echogenicity. Portal vein is patent on color Doppler imaging with normal direction of blood flow towards the liver. Other: None. IMPRESSION: Negative.  No hepatobiliary abnormality identified. Electronically Signed   By: Danae Orleans M.D.   On: 01/02/2024 18:02   ECHOCARDIOGRAM COMPLETE Result Date: 01/02/2024    ECHOCARDIOGRAM REPORT   Patient Name:   HARRINGTON JOBE Date of Exam: 01/02/2024 Medical Rec #:  [id number]         Height:       70.0 in Accession #:    [id number]        Weight:       142.6 lb Date of Birth:  Dec 24, 1985         BSA:          1.808 m Patient Age:    37 years          BP:           100/68 mmHg Patient Gender: M                 HR:  86 bpm. Exam Location:  Inpatient Procedure: 2D Echo, Cardiac Doppler, Color Doppler and Intracardiac            Opacification Agent (Both Spectral and Color Flow Doppler were            utilized during procedure). Indications:    Congestive Heart Failure  History:        Patient has no prior history of Echocardiogram examinations.                 Risk Factors:Current Smoker.  Sonographer:    Karma Ganja Referring Phys: [id number] Cristopher Peru  Sonographer Comments: Technically difficult study due to poor echo windows and echo performed with patient supine and on artificial respirator. IMPRESSIONS  1. Abnormal septal motion strain and 3D EF not performed. Left ventricular ejection fraction, by estimation, is 50 to 55%. The left ventricle has low normal function. The left ventricle has no regional wall  motion abnormalities. Left ventricular diastolic parameters were normal.  2. Right ventricular systolic function is normal. The right ventricular size is normal.  3. The mitral valve is abnormal. Trivial mitral valve regurgitation. No evidence of mitral stenosis.  4. The aortic valve was not well visualized. Aortic valve regurgitation is not visualized. No aortic stenosis is present.  5. The inferior vena cava is normal in size with greater than 50% respiratory variability, suggesting right atrial pressure of 3 mmHg. FINDINGS  Left Ventricle: Abnormal septal motion strain and 3D EF not performed. Left ventricular ejection fraction, by estimation, is 50 to 55%. The left ventricle has low normal function. The left ventricle has no regional wall motion abnormalities. Definity contrast agent was given IV to delineate the left ventricular endocardial borders. Strain was performed and the global longitudinal strain is indeterminate. The left ventricular internal cavity size was normal in size. There is no left ventricular hypertrophy. Left ventricular diastolic parameters were normal. Right Ventricle: The right ventricular size is normal. No increase in right ventricular wall thickness. Right ventricular systolic function is normal. Left Atrium: Left atrial size was normal in size. Right Atrium: Right atrial size was normal in size. Pericardium: There is no evidence of pericardial effusion. Mitral Valve: The mitral valve is abnormal. There is mild thickening of the mitral valve leaflet(s). There is mild calcification of the mitral valve leaflet(s). Trivial mitral valve regurgitation. No evidence of mitral valve stenosis. MV peak gradient, 1.5 mmHg. The mean mitral valve gradient is 1.0 mmHg. Tricuspid Valve: The tricuspid valve is normal in structure. Tricuspid valve regurgitation is not demonstrated. No evidence of tricuspid stenosis. Aortic Valve: The aortic valve was not well visualized. Aortic valve regurgitation is  not visualized. No aortic stenosis is present. Aortic valve mean gradient measures 2.0 mmHg. Aortic valve peak gradient measures 4.0 mmHg. Aortic valve area, by VTI measures 1.81 cm. Pulmonic Valve: The pulmonic valve was normal in structure. Pulmonic valve regurgitation is not visualized. No evidence of pulmonic stenosis. Aorta: The aortic root is normal in size and structure. Venous: The inferior vena cava is normal in size with greater than 50% respiratory variability, suggesting right atrial pressure of 3 mmHg. IAS/Shunts: No atrial level shunt detected by color flow Doppler. Additional Comments: 3D was performed not requiring image post processing on an independent workstation and was indeterminate.  LEFT VENTRICLE PLAX 2D LVIDd:         3.70 cm     Diastology LVIDs:         2.85 cm  LV e' medial:    7.29 cm/s LV PW:         0.90 cm     LV E/e' medial:  7.8 LV IVS:        0.90 cm     LV e' lateral:   10.00 cm/s LVOT diam:     1.90 cm     LV E/e' lateral: 5.7 LV SV:         28 LV SV Index:   15 LVOT Area:     2.84 cm  LV Volumes (MOD) LV vol d, MOD A2C: 66.9 ml LV vol d, MOD A4C: 64.7 ml LV vol s, MOD A2C: 35.0 ml LV vol s, MOD A4C: 32.3 ml LV SV MOD A2C:     31.9 ml LV SV MOD A4C:     64.7 ml LV SV MOD BP:      34.5 ml RIGHT VENTRICLE            IVC RV Basal diam:  2.70 cm    IVC diam: 1.90 cm RV S prime:     7.40 cm/s LEFT ATRIUM           Index        RIGHT ATRIUM          Index LA diam:      3.00 cm 1.66 cm/m   RA Area:     9.11 cm LA Vol (A2C): 28.3 ml 15.65 ml/m  RA Volume:   20.70 ml 11.45 ml/m LA Vol (A4C): 16.9 ml 9.35 ml/m  AORTIC VALVE AV Area (Vmax):    1.76 cm AV Area (Vmean):   1.70 cm AV Area (VTI):     1.81 cm AV Vmax:           100.00 cm/s AV Vmean:          66.500 cm/s AV VTI:            0.154 m AV Peak Grad:      4.0 mmHg AV Mean Grad:      2.0 mmHg LVOT Vmax:         62.10 cm/s LVOT Vmean:        39.800 cm/s LVOT VTI:          0.098 m LVOT/AV VTI ratio: 0.64  AORTA Ao Root diam:  3.10 cm MITRAL VALVE MV Area (PHT): 3.34 cm    SHUNTS MV Area VTI:   1.79 cm    Systemic VTI:  0.10 m MV Peak grad:  1.5 mmHg    Systemic Diam: 1.90 cm MV Mean grad:  1.0 mmHg MV Vmax:       0.62 m/s MV Vmean:      45.3 cm/s MV Decel Time: 227 msec MV E velocity: 57.00 cm/s MV A velocity: 51.80 cm/s MV E/A ratio:  1.10 Charlton Haws MD Electronically signed by Charlton Haws MD Signature Date/Time: 01/02/2024/12:56:19 PM    Final    CT Cervical Spine Wo Contrast Result Date: 01/02/2024 CLINICAL DATA:  38 year old male found down, unresponsive. Intubated. EXAM: CT CERVICAL SPINE WITHOUT CONTRAST TECHNIQUE: Multidetector CT imaging of the cervical spine was performed without intravenous contrast. Multiplanar CT image reconstructions were also generated. RADIATION DOSE REDUCTION: This exam was performed according to the departmental dose-optimization program which includes automated exposure control, adjustment of the mA and/or kV according to patient size and/or use of iterative reconstruction technique. COMPARISON:  Head CT today.  Cervical spine CT 11/23/2008. FINDINGS: Alignment: Chronic straightening of  cervical lordosis. Cervicothoracic junction alignment is within normal limits. Bilateral posterior element alignment is within normal limits. Skull base and vertebrae: Normal background bone mineralization. Visualized skull base is intact. No atlanto-occipital dissociation. No acute osseous abnormality identified. Soft tissues and spinal canal: No prevertebral fluid or swelling. No visible canal hematoma. Intubated and oral enteric tube in place, both taken appropriate course. Small volume of fluid in the pharynx. Negative visible noncontrast neck soft tissues otherwise. Disc levels:  Negative. Upper chest: Visible upper thoracic levels appear intact. Upper lungs appear well aerated. Trace gas in the lower right internal jugular vein, probably IV access related. Other: Left mandible condyle chronic deformity with  sclerosis and ORIF hardware. IMPRESSION: 1. No acute traumatic injury identified in the cervical spine. 2. Intubated and oral enteric tube with appropriate visible course. 3. 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 Electronically Signed   By: Odessa Fleming M.D.   On: 01/02/2024 05:08   CT Head Wo Contrast Result Date: 01/02/2024 CLINICAL DATA:  38 year old male found down, unresponsive. Intubated. EXAM: CT HEAD WITHOUT CONTRAST TECHNIQUE: Contiguous axial images were obtained from the base of the skull through the vertex without intravenous contrast. RADIATION DOSE REDUCTION: This exam was performed according to the departmental dose-optimization program which includes automated exposure control, adjustment of the mA and/or kV according to patient size and/or use of iterative reconstruction technique. COMPARISON:  Head CT 10/23/2012. FINDINGS: Brain: Cerebral volume remains normal. No midline shift, ventriculomegaly, mass effect, evidence of mass lesion, intracranial hemorrhage or evidence of cortically based acute infarction. Gray-white matter differentiation is within normal limits throughout the brain. Vascular: No suspicious intracranial vascular hyperdensity. Skull: Chronic posttraumatic changes and post ORIF partially visible to the left mandible. Asymmetric TMJ alignment there associated. No acute skull fracture or No acute osseous abnormality identified. Sinuses/Orbits: Intubated on the scout view. Retained secretions in the visible pharynx. Bilateral ethmoid and maxillary sinus secretions are relatively mild. Other Visualized paranasal sinuses and mastoids are stable and well aerated. Other: Visualized orbits and scalp soft tissues are within normal limits. IMPRESSION: 1. Stable and normal noncontrast CT appearance of the brain. 2. Intubated with retained secretions in the pharynx and paranasal sinuses. 3. Chronic appearing posttraumatic and post ORIF changes to the left mandible ramus.  Electronically Signed   By: Odessa Fleming M.D.   On: 01/02/2024 05:04   DG Abd Portable 1 View Result Date: 01/02/2024 CLINICAL DATA:  OG tube placement EXAM: PORTABLE ABDOMEN - 1 VIEW COMPARISON:  None Available. FINDINGS: Enteric tube tip and side port overlie the proximal stomach. Upper gas pattern is normal IMPRESSION: Enteric tube tip and side port overlie the proximal stomach Electronically Signed   By: Jasmine Pang M.D.   On: 01/02/2024 01:44   DG Chest Portable 1 View Result Date: 01/02/2024 CLINICAL DATA:  Intubated EXAM: PORTABLE CHEST 1 VIEW COMPARISON:  11/29/2010 FINDINGS: Endotracheal tube tip about 1.7 cm superior to carina. Enteric tube tip in the GE junction region. Normal cardiac size. No acute airspace disease or pneumothorax IMPRESSION: 1. Endotracheal tube tip about 1.7 cm superior to carina. 2. Clear lung fields Electronically Signed   By: Jasmine Pang M.D.   On: 01/02/2024 01:44     Subjective: No acute issues or events overnight denies nausea vomiting diarrhea constipation headache fevers chills or chest pain   Discharge Exam: Vitals:   01/07/24 0526 01/07/24 0752  BP: 117/75 119/79  Pulse: 97 83  Resp: 16   Temp: 98.8 F (37.1 C)  SpO2: 99% 98%   Vitals:   01/06/24 2057 01/07/24 0500 01/07/24 0526 01/07/24 0752  BP: 107/72  117/75 119/79  Pulse: 96  97 83  Resp: 18  16   Temp: 98.5 F (36.9 C)  98.8 F (37.1 C)   TempSrc: Oral  Oral   SpO2: 99%  99% 98%  Weight:  65.4 kg    Height:        General: Pt is alert, awake, not in acute distress Cardiovascular: RRR, S1/S2 +, no rubs, no gallops Respiratory: CTA bilaterally, no wheezing, no rhonchi Abdominal: Soft, NT, ND, bowel sounds + Extremities: no edema, no cyanosis    The results of significant diagnostics from this hospitalization (including imaging, microbiology, ancillary and laboratory) are listed below for reference.     Microbiology: Recent Results (from the past 240 hours)  Blood culture  (routine x 2)     Status: None   Collection Time: 01/02/24  1:26 AM   Specimen: BLOOD  Result Value Ref Range Status   Specimen Description   Final    BLOOD BLOOD LEFT FOREARM Performed at [facility], [address] [place], [place] [address]    Special Requests   Final    BOTTLES DRAWN AEROBIC AND ANAEROBIC Blood Culture adequate volume Performed at [facility], [address], [place], [place] [address]    Culture   Final    NO GROWTH 5 DAYS Performed at [facility], [address], [place], [place] [address]    Report Status 01/07/2024 FINAL  Final  Blood culture (routine x 2)     Status: None   Collection Time: 01/02/24  1:26 AM   Specimen: BLOOD  Result Value Ref Range Status   Specimen Description   Final    BLOOD LEFT ANTECUBITAL Performed at [facility], [address], [place], [place] [address]    Special Requests   Final    BOTTLES DRAWN AEROBIC AND ANAEROBIC Blood Culture results may not be optimal due to an inadequate volume of blood received in culture bottles Performed at [facility], [address] [place], [place] [address]    Culture   Final    NO GROWTH 5 DAYS Performed at [facility], [address], [place], [place] [address]    Report Status 01/07/2024 FINAL  Final  MRSA Next Gen by PCR, Nasal     Status: None   Collection Time: 01/02/24  8:25 AM   Specimen: Nasal Mucosa; Nasal Swab  Result Value Ref Range Status   MRSA by PCR Next Gen NOT DETECTED NOT DETECTED Final    Comment: (NOTE) The GeneXpert MRSA Assay (FDA approved for NASAL specimens only), is one component of a comprehensive MRSA colonization surveillance program. It is not intended to diagnose MRSA infection nor to guide or monitor treatment for MRSA infections. Test performance is not FDA approved in patients less than 79 years old. Performed at [facility], [address], [place], [place] [address]      Labs: BNP (last 3 results) No results for input(s): "BNP" in the last 8760 hours. Basic Metabolic Panel: Recent Labs  Lab 01/02/24 0600 01/03/24 0307 01/04/24 0303 01/05/24 0239 01/06/24 0637 01/07/24 0334  NA 137 138 129* 136 134* 136  K 3.8 3.2* 3.5 3.8 4.2 3.7  CL 105 106 100 106 104 104  CO2 18* 19* 21* 20* 22 25  GLUCOSE 102* 85  104* 100* 124* 132*  BUN 17 12 9 11 12 11   CREATININE 1.14 0.77 0.90 0.63 0.76 0.85  CALCIUM 6.8* 7.9* 7.7* 8.3* 8.8* 8.6*  MG 1.6* 1.8 1.9  --   --   --   PHOS 2.7  --   --   --   --   --    Liver Function Tests: Recent Labs  Lab 01/02/24 0600 01/03/24 0307 01/05/24 0239 01/06/24 0637 01/07/24 0334  AST 105* 213* 140* 118* 76*  ALT 53* 66* 69* 75* 65*  ALKPHOS 55 60 75 85 77  BILITOT 2.1* 3.1* 1.4* 0.8 0.6  PROT 5.4* 5.1* 6.3* 6.6 6.2*  ALBUMIN 2.7* 2.4* 2.9* 3.0* 2.8*   Recent Labs  Lab 01/02/24 0115 01/03/24 0307  LIPASE 58* 37   Recent Labs  Lab 01/02/24 0115  AMMONIA 22   CBC: Recent Labs  Lab 01/02/24 0115 01/02/24 0124 01/02/24 0600 01/03/24 0307 01/05/24 0239 01/06/24 0637 01/07/24 0334  WBC 7.4  --  10.6* 8.8 7.0 7.6 6.3  NEUTROABS 3.8  --   --   --   --   --   --   HGB 14.9   < > 13.8 13.6 14.1 14.3 13.5  HCT 45.0   < > 40.2 40.5 42.8 41.5 41.8  MCV 99.6  --  96.2 95.5 98.6 96.3 99.8  PLT 243  --  196 137* 138* 169 186   < > = values in this interval not displayed.   Cardiac Enzymes: Recent Labs  Lab 01/02/24 0600  CKTOTAL 386   BNP: Invalid input(s): "POCBNP" CBG: Recent Labs  Lab 01/03/24 1951 01/03/24 2333 01/04/24 0425 01/04/24 0751 01/04/24 1112  GLUCAP 153* 91 113* 95 153*   D-Dimer No results for input(s): "DDIMER" in the last 72 hours. Hgb A1c No results for input(s): "HGBA1C" in the last 72 hours. Lipid Profile No results for input(s): "CHOL", "HDL", "LDLCALC", "TRIG", "CHOLHDL", "LDLDIRECT" in the last 72 hours. Thyroid function  studies No results for input(s): "TSH", "T4TOTAL", "T3FREE", "THYROIDAB" in the last 72 hours.  Invalid input(s): "FREET3" Anemia work up No results for input(s): "VITAMINB12", "FOLATE", "FERRITIN", "TIBC", "IRON", "RETICCTPCT" in the last 72 hours. Urinalysis    Component Value Date/Time   COLORURINE YELLOW 01/02/2024 0156   APPEARANCEUR HAZY (A) 01/02/2024 0156   LABSPEC 1.008 01/02/2024 0156   PHURINE 5.0 01/02/2024 0156   GLUCOSEU NEGATIVE 01/02/2024 0156   HGBUR SMALL (A) 01/02/2024 0156   BILIRUBINUR NEGATIVE 01/02/2024 0156   KETONESUR NEGATIVE 01/02/2024 0156   PROTEINUR NEGATIVE 01/02/2024 0156   UROBILINOGEN 1.0 06/11/2014 0500   NITRITE NEGATIVE 01/02/2024 0156   LEUKOCYTESUR NEGATIVE 01/02/2024 0156   Sepsis Labs Recent Labs  Lab 01/03/24 0307 01/05/24 0239 01/06/24 0637 01/07/24 0334  WBC 8.8 7.0 7.6 6.3   Microbiology Recent Results (from the past 240 hours)  Blood culture (routine x 2)     Status: None   Collection Time: 01/02/24  1:26 AM   Specimen: BLOOD  Result Value Ref Range Status   Specimen Description   Final    BLOOD BLOOD LEFT FOREARM Performed at [facility], [address], [place], [place] [address]    Special Requests   Final    BOTTLES DRAWN AEROBIC AND ANAEROBIC Blood Culture adequate volume Performed at [facility], [address], [place], [place] [address]    Culture   Final    NO GROWTH 5 DAYS Performed at [facility], [address] [place], [place] [address]    Report Status 01/07/2024 FINAL  Final  Blood culture (routine x 2)     Status: None   Collection Time: 01/02/24  1:26 AM   Specimen: BLOOD  Result Value Ref Range Status   Specimen Description   Final    BLOOD LEFT ANTECUBITAL Performed at [facility], [address], [place], [place] [address]    Special Requests   Final    BOTTLES DRAWN AEROBIC AND ANAEROBIC Blood Culture results may not be  optimal due to an inadequate volume of blood received in culture bottles Performed at [facility], [address], [place], [place] [address]    Culture   Final    NO GROWTH 5 DAYS Performed at [facility], [address], [place], [place] [address]    Report Status 01/07/2024 FINAL  Final  MRSA Next Gen by PCR, Nasal     Status: None   Collection Time: 01/02/24  8:25 AM   Specimen: Nasal Mucosa; Nasal Swab  Result Value Ref Range Status   MRSA by PCR Next Gen NOT DETECTED NOT DETECTED Final    Comment: (NOTE) The GeneXpert MRSA Assay (FDA approved for NASAL specimens only), is one component of a comprehensive MRSA colonization surveillance program. It is not intended to diagnose MRSA infection nor to guide or monitor treatment for MRSA infections. Test performance is not FDA approved in patients less than 57 years old. Performed at [facility], [address], [place], [place] [address]      Time coordinating discharge: Over 30 minutes  SIGNED:   Azucena Fallen, DO [facility] 01/07/2024, 5:59 PM Pager   If 7PM-7AM, please contact [email or web address] [email or web address]
